# Patient Record
Sex: Female | Born: 1980 | Race: Black or African American | Hispanic: No | Marital: Single | Smoking: Never smoker
Health system: Southern US, Community
[De-identification: ages and names within clinical notes are randomized; demographics above are authoritative.]

## PROBLEM LIST (undated history)

## (undated) ENCOUNTER — Inpatient Hospital Stay (HOSPITAL_COMMUNITY): Payer: Self-pay

## (undated) DIAGNOSIS — R87619 Unspecified abnormal cytological findings in specimens from cervix uteri: Secondary | ICD-10-CM

## (undated) DIAGNOSIS — K219 Gastro-esophageal reflux disease without esophagitis: Secondary | ICD-10-CM

## (undated) DIAGNOSIS — D573 Sickle-cell trait: Secondary | ICD-10-CM

## (undated) DIAGNOSIS — O98319 Other infections with a predominantly sexual mode of transmission complicating pregnancy, unspecified trimester: Secondary | ICD-10-CM

## (undated) DIAGNOSIS — A630 Anogenital (venereal) warts: Secondary | ICD-10-CM

## (undated) DIAGNOSIS — B999 Unspecified infectious disease: Secondary | ICD-10-CM

## (undated) DIAGNOSIS — IMO0002 Reserved for concepts with insufficient information to code with codable children: Secondary | ICD-10-CM

## (undated) DIAGNOSIS — L709 Acne, unspecified: Secondary | ICD-10-CM

## (undated) HISTORY — PX: COLPOSCOPY: SHX161

## (undated) HISTORY — PX: NO PAST SURGERIES: SHX2092

---

## 2012-10-26 NOTE — L&D Delivery Note (Signed)
Delivery Note At 7:36 PM a viable female was delivered via Vaginal, Spontaneous Delivery (Presentation: Left Occiput Anterior).  APGAR: 9, 9; weight .   Placenta status: Intact, Spontaneous.  Cord: 3 vessels with the following complications: None.  Cord pH: not done  Anesthesia: Epidural Local  Episiotomy: Median Lacerations: 2nd degree Suture Repair: 2.0 vicryl Est. Blood Loss (mL):   Mom to postpartum.  Baby to nursery-stable.  MARSHALL,BERNARD A 08/18/2013, 7:51 PM

## 2012-10-31 ENCOUNTER — Emergency Department (INDEPENDENT_AMBULATORY_CARE_PROVIDER_SITE_OTHER)
Admission: EM | Admit: 2012-10-31 | Discharge: 2012-10-31 | Disposition: A | Discharge status: Home / Self Care | Payer: Self-pay | Source: Home / Self Care | Attending: Family Medicine | Admitting: Family Medicine

## 2012-10-31 ENCOUNTER — Encounter (HOSPITAL_COMMUNITY): Payer: Self-pay | Admitting: *Deleted

## 2012-10-31 DIAGNOSIS — J111 Influenza due to unidentified influenza virus with other respiratory manifestations: Secondary | ICD-10-CM

## 2012-10-31 NOTE — ED Provider Notes (Signed)
History     CSN: [id number]  Arrival date & time 10/31/12  1807   First MD Initiated Contact with Patient 10/31/12 1816      No chief complaint on file.   (Consider location/radiation/quality/duration/timing/severity/associated sxs/prior treatment) Patient is a 32 y.o. female presenting with pharyngitis. The history is provided by the patient.  Sore Throat This is a new problem. The current episode started more than 2 days ago. The problem has been gradually worsening. Pertinent negatives include no chest pain, no abdominal pain, no headaches and no shortness of breath.    No past medical history on file.  No past surgical history on file.  No family history on file.  History  Substance Use Topics  . Smoking status: Not on file  . Smokeless tobacco: Not on file  . Alcohol Use: Not on file    OB History    No data available      Review of Systems  Constitutional: Positive for fever and chills.  HENT: Positive for congestion, sore throat, rhinorrhea and postnasal drip.   Respiratory: Positive for cough. Negative for shortness of breath.   Cardiovascular: Negative for chest pain.  Gastrointestinal: Negative.  Negative for abdominal pain.  Genitourinary: Negative.   Neurological: Negative for headaches.    Allergies  Review of patient's allergies indicates not on file.  Home Medications  No current outpatient prescriptions on file.  BP 134/80  Pulse 80  Temp 98.5 F (36.9 C) (Oral)  Resp 18  SpO2 98%  Physical Exam  Nursing note and vitals reviewed. Constitutional: She is oriented to person, place, and time. She appears well-developed and well-nourished.  HENT:  Head: Normocephalic.  Right Ear: External ear normal.  Left Ear: External ear normal.  Mouth/Throat: Oropharynx is clear and moist.  Eyes: Conjunctivae normal and EOM are normal. Pupils are equal, round, and reactive to light.  Neck: Normal range of motion. Neck supple.  Cardiovascular: Normal  rate, regular rhythm, normal heart sounds and intact distal pulses.   Pulmonary/Chest: Effort normal and breath sounds normal.  Abdominal: Soft. Bowel sounds are normal.  Lymphadenopathy:    She has no cervical adenopathy.  Neurological: She is alert and oriented to person, place, and time.  Skin: Skin is warm and dry.  Psychiatric: She has a normal mood and affect.    ED Course  Procedures (including critical care time)  Labs Reviewed - No data to display No results found.   1. Influenza-like illness       MDM          Linna Hoff, MD 10/31/12 [phone]

## 2012-10-31 NOTE — ED Notes (Signed)
C/o sore throat, nausea and fever onset 3 days ago.  Was sick 3 weeks prior had a sore throat, vomiting and congestion, but got better.

## 2013-01-12 ENCOUNTER — Inpatient Hospital Stay (HOSPITAL_COMMUNITY): Payer: 59

## 2013-01-12 ENCOUNTER — Encounter (HOSPITAL_COMMUNITY): Payer: Self-pay | Admitting: *Deleted

## 2013-01-12 ENCOUNTER — Inpatient Hospital Stay (HOSPITAL_COMMUNITY)
Admission: AD | Admit: 2013-01-12 | Discharge: 2013-01-12 | Disposition: A | Discharge status: Home / Self Care | Payer: 59 | Source: Ambulatory Visit | Attending: Obstetrics & Gynecology | Admitting: Obstetrics & Gynecology

## 2013-01-12 DIAGNOSIS — Z3490 Encounter for supervision of normal pregnancy, unspecified, unspecified trimester: Secondary | ICD-10-CM

## 2013-01-12 DIAGNOSIS — O210 Mild hyperemesis gravidarum: Secondary | ICD-10-CM | POA: Insufficient documentation

## 2013-01-12 DIAGNOSIS — O219 Vomiting of pregnancy, unspecified: Secondary | ICD-10-CM

## 2013-01-12 HISTORY — DX: Reserved for concepts with insufficient information to code with codable children: IMO0002

## 2013-01-12 HISTORY — DX: Unspecified abnormal cytological findings in specimens from cervix uteri: R87.619

## 2013-01-12 LAB — URINALYSIS, ROUTINE W REFLEX MICROSCOPIC
Glucose, UA: NEGATIVE mg/dL
Ketones, ur: NEGATIVE mg/dL
Leukocytes, UA: NEGATIVE
Nitrite: NEGATIVE
Protein, ur: NEGATIVE mg/dL
Urobilinogen, UA: 0.2 mg/dL (ref 0.0–1.0)

## 2013-01-12 LAB — WET PREP, GENITAL: Trich, Wet Prep: NONE SEEN

## 2013-01-12 LAB — CBC
MCH: 24.6 pg — ABNORMAL LOW (ref 26.0–34.0)
MCHC: 34 g/dL (ref 30.0–36.0)
Platelets: 308 10*3/uL (ref 150–400)
RDW: 19 % — ABNORMAL HIGH (ref 11.5–15.5)

## 2013-01-12 LAB — HCG, QUANTITATIVE, PREGNANCY: hCG, Beta Chain, Quant, S: 22224 m[IU]/mL — ABNORMAL HIGH (ref <5)

## 2013-01-12 LAB — OB RESULTS CONSOLE GC/CHLAMYDIA: Gonorrhea: NEGATIVE

## 2013-01-12 LAB — POCT PREGNANCY, URINE: Preg Test, Ur: POSITIVE — AB

## 2013-01-12 MED ORDER — ONDANSETRON HCL 8 MG PO TABS: 8.0000 mg | ORAL_TABLET | Freq: Three times a day (TID) | ORAL | Status: DC | PRN

## 2013-01-12 MED ORDER — ONDANSETRON HCL 4 MG PO TABS
8.0000 mg | ORAL_TABLET | Freq: Once | ORAL | Status: AC
  Administered 2013-01-12: 8 mg via ORAL
  Filled 2013-01-12: qty 2

## 2013-01-12 MED ORDER — DOXYLAMINE-PYRIDOXINE 10-10 MG PO TBEC: 2.0000 | DELAYED_RELEASE_TABLET | Freq: Every evening | ORAL | Status: DC | PRN

## 2013-01-12 NOTE — MAU Note (Signed)
Name and DOB verified. Pt confirmed correct spelling on armband.

## 2013-01-12 NOTE — MAU Note (Signed)
Pt states positive upt 2 weeks ago. Denies abnormal vaginal d/c or bleeding.

## 2013-01-12 NOTE — MAU Provider Note (Signed)
History     CSN: [id number]  Arrival date and time: 01/12/13 1047   First Provider Initiated Contact with Patient 01/12/13 1314      Chief Complaint  Patient presents with  . Nausea   HPI THis is a 32 y.o. female at [redacted]w[redacted]d who presents with c/o nausea and weakness. Feels "dehydrated" though she drinks copious amounts of gatorade and other fluids.  Had "severe" abdominal pain last week which went away. No bleeding. Period in February came early and was not normal. Periods otherwise normal.  RN Note: Not feeling good, nauseated. First appt with Dr Tresa Res next week, + HPT, not confirmed anywhere yet. Bad cramps a couple days ago, none now.       OB History   Grav Para Term Preterm Abortions TAB SAB Ect Mult Living   1               Past Medical History  Diagnosis Date  . Abnormal Pap smear     Past Surgical History  Procedure Laterality Date  . Colposcopy    . Colposcopy      History reviewed. No pertinent family history.  History  Substance Use Topics  . Smoking status: Never Smoker   . Smokeless tobacco: Never Used  . Alcohol Use: Yes     Comment: occasional    Allergies: No Known Allergies  Prescriptions prior to admission  Medication Sig Dispense Refill  . acetaminophen (TYLENOL) 500 MG tablet Take 500 mg by mouth as needed for pain (cramps).      . Prenatal Vit-Fe Fumarate-FA (PRENATAL MULTIVITAMIN) TABS Take 1 tablet by mouth daily at 12 noon.        Review of Systems  Constitutional: Positive for fever, chills and malaise/fatigue.  Gastrointestinal: Positive for nausea. Negative for vomiting, abdominal pain, diarrhea and constipation.  Genitourinary: Negative for dysuria.  Neurological: Positive for weakness. Negative for headaches.   Physical Exam   Blood pressure 129/69, pulse 99, temperature 98.4 F (36.9 C), temperature source Oral, resp. rate 18, height 5\' 8"  (1.727 m), weight 167 lb (75.751 kg), last menstrual period 12/01/2012.  Physical  Exam  Constitutional: She is oriented to person, place, and time. She appears well-developed and well-nourished. No distress.  HENT:  Head: Normocephalic.  Cardiovascular: Normal rate.   Respiratory: Effort normal.  GI: Soft. She exhibits no distension and no mass. There is tenderness (bilater lower pelvis). There is no rebound and no guarding.  Genitourinary: Vagina normal and uterus normal. No vaginal discharge found.  Musculoskeletal: Normal range of motion.  Neurological: She is alert and oriented to person, place, and time.  Skin: Skin is warm and dry.  Psychiatric: She has a normal mood and affect.  Uterus small, 6 wk size  MAU Course  Procedures  MDM Cultures, CBC, Quant. Results for orders placed during the hospital encounter of 01/12/13 (from the past 24 hour(s))  URINALYSIS, ROUTINE W REFLEX MICROSCOPIC     Status: None   Collection Time    01/12/13 11:15 AM      Result Value Range   Color, Urine YELLOW  YELLOW   APPearance CLEAR  CLEAR   Specific Gravity, Urine 1.015  1.005 - 1.030   pH 5.5  5.0 - 8.0   Glucose, UA NEGATIVE  NEGATIVE mg/dL   Hgb urine dipstick NEGATIVE  NEGATIVE   Bilirubin Urine NEGATIVE  NEGATIVE   Ketones, ur NEGATIVE  NEGATIVE mg/dL   Protein, ur NEGATIVE  NEGATIVE mg/dL   Urobilinogen,  UA 0.2  0.0 - 1.0 mg/dL   Nitrite NEGATIVE  NEGATIVE   Leukocytes, UA NEGATIVE  NEGATIVE  POCT PREGNANCY, URINE     Status: Abnormal   Collection Time    01/12/13 11:33 AM      Result Value Range   Preg Test, Ur POSITIVE (*) NEGATIVE  HCG, QUANTITATIVE, PREGNANCY     Status: Abnormal   Collection Time    01/12/13  1:21 PM      Result Value Range   hCG, Beta Chain, Quant, S 22224 (*) <5 mIU/mL  CBC     Status: Abnormal   Collection Time    01/12/13  1:22 PM      Result Value Range   WBC 6.7  4.0 - 10.5 K/uL   RBC 4.72  3.87 - 5.11 MIL/uL   Hemoglobin 11.6 (*) 12.0 - 15.0 g/dL   HCT 16.1 (*) 09.6 - 04.5 %   MCV 72.2 (*) 78.0 - 100.0 fL   MCH 24.6 (*)  26.0 - 34.0 pg   MCHC 34.0  30.0 - 36.0 g/dL   RDW 40.9 (*) 81.1 - 91.4 %   Platelets 308  150 - 400 K/uL  WET PREP, GENITAL     Status: Abnormal   Collection Time    01/12/13  1:26 PM      Result Value Range   Yeast Wet Prep HPF POC NONE SEEN  NONE SEEN   Trich, Wet Prep NONE SEEN  NONE SEEN   Clue Cells Wet Prep HPF POC NONE SEEN  NONE SEEN   WBC, Wet Prep HPF POC MODERATE (*) NONE SEEN   No evidence of dehydration. Does not need IV. Will give zofran (with constipation warning) and PO hydrate. Will check [place] to r/o ectopic due to pain  Assessment and Plan  A:  SIUP at 6.0 weeks       Nausea with no evidence of dehydration  P:  Discharge home      Rx antiemetic      Advised to start prenatal care   Medication List    TAKE these medications       acetaminophen 500 MG tablet  Commonly known as:  TYLENOL  Take 500 mg by mouth as needed for pain (cramps).     Doxylamine-Pyridoxine 10-10 MG Tbec  Commonly known as:  DICLEGIS  Take 2 tablets by mouth at bedtime as needed.     ondansetron 8 MG tablet  Commonly known as:  ZOFRAN  Take 1 tablet (8 mg total) by mouth every 8 (eight) hours as needed for nausea.     prenatal multivitamin Tabs  Take 1 tablet by mouth daily at 12 noon.         Wynelle Bourgeois 01/12/2013, 1:31 PM

## 2013-01-12 NOTE — MAU Note (Addendum)
Not feeling good, nauseated.  First appt with Dr Tresa Res next week, + HPT, not confirmed anywhere yet.  Bad cramps a couple days ago, none now.

## 2013-01-13 LAB — GC/CHLAMYDIA PROBE AMP: CT Probe RNA: NEGATIVE

## 2013-02-16 ENCOUNTER — Telehealth: Payer: Self-pay | Admitting: *Deleted

## 2013-02-16 ENCOUNTER — Other Ambulatory Visit: Payer: Self-pay | Admitting: Obstetrics and Gynecology

## 2013-02-16 MED ORDER — ONDANSETRON HCL 8 MG PO TABS: 8.0000 mg | ORAL_TABLET | Freq: Three times a day (TID) | ORAL | Status: DC | PRN

## 2013-02-16 NOTE — Telephone Encounter (Signed)
Patient called requesting a refill on Zofran that she received at [facility].  Patient has a new patient appointment on May 8th.  Advised patient to return to [facility] for refill as we are not legally allowed to refill until her appointment on May 8th (per Dr. Clearance Coots).

## 2013-02-27 ENCOUNTER — Other Ambulatory Visit: Payer: Self-pay | Admitting: Advanced Practice Midwife

## 2013-02-27 MED ORDER — ONDANSETRON HCL 8 MG PO TABS: 8.0000 mg | ORAL_TABLET | Freq: Three times a day (TID) | ORAL | Status: DC | PRN

## 2013-02-27 NOTE — Progress Notes (Signed)
Pt was prescribed Zofran at MAU visit on 4/24, called requesting refill. Having ongoing n/v, hasn't been able to get appointment with her regular OB/GYN yet. Rx sent.

## 2013-03-02 ENCOUNTER — Ambulatory Visit: Payer: Self-pay | Admitting: Obstetrics & Gynecology

## 2013-03-02 ENCOUNTER — Encounter: Payer: 59 | Admitting: Obstetrics & Gynecology

## 2013-03-02 ENCOUNTER — Encounter: Payer: Self-pay | Admitting: Obstetrics & Gynecology

## 2013-03-02 ENCOUNTER — Ambulatory Visit (INDEPENDENT_AMBULATORY_CARE_PROVIDER_SITE_OTHER): Payer: 59 | Admitting: Obstetrics & Gynecology

## 2013-03-02 VITALS — BP 116/85 | Temp 98.2°F | Wt 142.0 lb

## 2013-03-02 VITALS — BP 116/85 | HR 111 | Temp 98.2°F | Ht 66.0 in | Wt 142.0 lb

## 2013-03-02 DIAGNOSIS — Z3402 Encounter for supervision of normal first pregnancy, second trimester: Secondary | ICD-10-CM

## 2013-03-02 DIAGNOSIS — Z3201 Encounter for pregnancy test, result positive: Secondary | ICD-10-CM

## 2013-03-02 DIAGNOSIS — A630 Anogenital (venereal) warts: Secondary | ICD-10-CM

## 2013-03-02 DIAGNOSIS — Z3401 Encounter for supervision of normal first pregnancy, first trimester: Secondary | ICD-10-CM

## 2013-03-02 DIAGNOSIS — Z3400 Encounter for supervision of normal first pregnancy, unspecified trimester: Secondary | ICD-10-CM | POA: Insufficient documentation

## 2013-03-02 DIAGNOSIS — O98319 Other infections with a predominantly sexual mode of transmission complicating pregnancy, unspecified trimester: Secondary | ICD-10-CM

## 2013-03-02 LAB — POCT URINALYSIS DIPSTICK
Nitrite, UA: NEGATIVE
Urobilinogen, UA: NEGATIVE
pH, UA: 6

## 2013-03-02 LAB — HIV ANTIBODY (ROUTINE TESTING W REFLEX): HIV: NONREACTIVE

## 2013-03-02 NOTE — Progress Notes (Signed)
Encounter entered in error.

## 2013-03-02 NOTE — Progress Notes (Signed)
Cardiac activity on U/S. 

## 2013-03-02 NOTE — Progress Notes (Signed)
P 111 PAP 3years ago Subjective:    Carlei Huang is being seen today for her first obstetrical visit.  This is not a planned pregnancy. She is at [redacted]w[redacted]d gestation. Her obstetrical history is significant for none. Relationship with FOB: involved. Patient undecided intend to breast feed. Pregnancy history fully reviewed.  Menstrual History: OB History   Grav Para Term Preterm Abortions TAB SAB Ect Mult Living   1                Patient's last menstrual period was 12/01/2012.    The following portions of the patient's history were reviewed and updated as appropriate: allergies, current medications, past family history, past medical history, past social history, past surgical history and problem list.  Review of Systems Pertinent items are noted in HPI.    Objective:   General Appearance:    Alert, cooperative, no distress, appears stated age  Head:    Normocephalic, without obvious abnormality, atraumatic  Eyes:    PERRL, conjunctiva/corneas clear, EOM's intact, fundi    benign, both eyes  Ears:    Normal TM's and external ear canals, both ears  Nose:   Nares normal, septum midline, mucosa normal, no drainage    or sinus tenderness  Throat:   Lips, mucosa, and tongue normal; teeth and gums normal  Neck:   Supple, symmetrical, trachea midline, no adenopathy;    thyroid:  no enlargement/tenderness/nodules; no carotid   bruit or JVD  Back:     Symmetric, no curvature, ROM normal, no CVA tenderness  Lungs:     Clear to auscultation bilaterally, respirations unlabored  Chest Wall:    No tenderness or deformity   Heart:    Regular rate and rhythm, S1 and S2 normal, no murmur, rub   or gallop  Breast Exam:    No tenderness, masses, or nipple abnormality  Abdomen:     Soft, non-tender, bowel sounds active all four quadrants,    no masses, no organomegaly  Genitalia:    Multiple 5 mm verrucous lesions, discharge or tenderness  Extremities:   Extremities normal, atraumatic, no cyanosis or  edema  Pulses:   2+ and symmetric all extremities  Skin:   Skin color, texture, turgor normal, no rashes or lesions  Lymph nodes:   Cervical, supraclavicular, and axillary nodes normal  Neurologic:   CNII-XII intact, normal strength, sensation and reflexes    throughout    Assessment:    Pregnancy at [redacted]w[redacted]d weeks    Plan:    Initial labs drawn. Prenatal vitamins. Problem list reviewed and updated. AFP3 discussed: undecided. Role of ultrasound in pregnancy discussed; fetal survey: requested. Amniocentesis discussed: not indicated. Follow up in 4 weeks. 50% of 20 min visit spent on counseling and coordination of care.

## 2013-03-02 NOTE — Patient Instructions (Signed)
CF Gene Mutation Testing This is a test used to detect cystic fibrosis (CF) genetic mutations to establish CF carrier status or to establish the diagnosis of CF in an individual. The CF gene mutation test identifies mutations in the CFTR gene on chromosome 7. Each cell in the human body (except sperm and eggs) has 46 chromosomes (23 inherited from the mother and 23 from the father). Genes on these chromosomes form the body's blueprint for producing proteins that control body functions. Cystic fibrosis is caused by a mutation in a pair of genes located on chromosomes 7. Both copies of this gene must be abnormal to cause CF. If only one copy of the gene pair is mutated, the patient will be a carrier. Carriers are not ill, they do not have any symptoms, but they can pass their abnormal CF gene copy on to their children.  When a newborn infant has meconium ileus (no stools in the first 24 to 48 hours of life) or when a person has symptoms of CF (salty sweat, persistent respiratory infections, wheezing, persistent diarrhea, foul-smelling greasy stools, malnutrition, and vitamin deficiency); if a person has a positive sweat chloride or IRT test or a close relative who has been diagnosed with CF; when a patient is undergoing genetic counseling and wants to find out if they are a CF carrier; or for prenatal diagnosis. PREPARATION FOR TEST A blood sample drawn from an infant's heel; a spot of blood that is put onto filter paper; or a blood sample is drawn from a vein in the arm. NORMAL FINDINGS No genetic mutation. Ranges for normal findings may vary among different laboratories and hospitals. You should always check with your doctor after having lab work or other tests done to discuss the meaning of your test results and whether your values are considered within normal limits. MEANING OF TEST Your caregiver will go over the test results with you and discuss the importance and meaning of your results, as well as  treatment options and the need for additional tests if necessary. OBTAINING THE TEST RESULTS It is your responsibility to obtain your test results. Ask the lab or department performing the test when and how you will get your results. Document Released: 11/05/2004 Document Revised: 01/04/2012 Document Reviewed: 09/19/2008 [employer] Patient Information 2013 [employer]. AFP Maternal This is a routine screen (tests) used to check for fetal abnormalities such as Down syndrome and neural tube defects. Down Syndrome is a chromosomal abnormality, sometimes called Trisomy 21. Neural tube defects are serious birth defects. The brain, spinal cord, or their coverings do not develop completely. Women should be tested in the 15th to 20th week of pregnancy. The msAFP screen involves three or four tests that measure substances found in the blood that make the testing better. During development, AFP levels in fetal blood and amniotic fluid rise until about 12 weeks. The levels then gradually fall until birth. AFP is a protein produce by fetal tissue. AFP crosses the placenta and appears in the maternal blood. A baby with an open neural tube defect has an opening in its spine, head, or abdominal wall that allows higher-than-usual amounts of AFP to pass into the mother's blood. If a screen is positive, more tests are needed to make a diagnosis. These include ultrasound and perhaps amniocentesis (checking the fluid that surrounds the baby). These tests are used to help women and their caregivers make decisions about the management of their pregnancies. In pregnancies where the fetus is carrying the chromosomal   defect that results in Down syndrome, the levels of AFP and unconjugated estriol tend to be low and hCG and inhibin A levels high.  PREPARATION FOR TEST Blood is drawn from a vein in your arm usually between the 15th and 20th weeks of pregnancy. Four different tests on your blood are done. These are AFP, hCG,  unconjugated estriol, and inhibin A. The combination of tests produces a more accurate result. NORMAL FINDINGS   Adult: less than 40ng/mL or less than 40 mg/L (SI units)  Child younger than1 year: less than 30 ng/mL Ranges are stratified by weeks of gestation and vary among laboratories. Ranges for normal findings may vary among different laboratories and hospitals. You should always check with your doctor after having lab work or other tests done to discuss the meaning of your test results and whether your values are considered within normal limits. MEANING OF TEST  These are screening tests. Not all fetal abnormalities will give positive test results. Of all women who have positive AFP screening results, only a very small number of them have babies who actually have a neural tube defect or chromosomal abnormality. Your caregiver will go over the test results with you and discuss the importance and meaning of your results, as well as treatment options and the need for additional tests if necessary. OBTAINING THE TEST RESULTS It is your responsibility to obtain your test results. Ask the lab or department performing the test when and how you will get your results. Document Released: 11/03/2004 Document Revised: 01/04/2012 Document Reviewed: 09/15/2008 [employer] Patient Information 2013 [employer].   

## 2013-03-03 LAB — OBSTETRIC PANEL
Eosinophils Absolute: 0.1 10*3/uL (ref 0.0–0.7)
Eosinophils Relative: 1 % (ref 0–5)
HCT: 36.5 % (ref 36.0–46.0)
Hemoglobin: 12.4 g/dL (ref 12.0–15.0)
Lymphocytes Relative: 31 % (ref 12–46)
Lymphs Abs: 1.5 10*3/uL (ref 0.7–4.0)
MCH: 24.9 pg — ABNORMAL LOW (ref 26.0–34.0)
MCV: 73.4 fL — ABNORMAL LOW (ref 78.0–100.0)
Monocytes Relative: 7 % (ref 3–12)
Platelets: 270 10*3/uL (ref 150–400)
RBC: 4.97 MIL/uL (ref 3.87–5.11)
Rh Type: POSITIVE
Rubella: 7.89 Index — ABNORMAL HIGH (ref <0.90)
WBC: 4.9 10*3/uL (ref 4.0–10.5)

## 2013-03-04 LAB — CULTURE, OB URINE: Colony Count: 5000

## 2013-03-06 ENCOUNTER — Encounter: Payer: Self-pay | Admitting: Obstetrics & Gynecology

## 2013-03-06 DIAGNOSIS — E559 Vitamin D deficiency, unspecified: Secondary | ICD-10-CM | POA: Insufficient documentation

## 2013-03-06 DIAGNOSIS — D573 Sickle-cell trait: Secondary | ICD-10-CM | POA: Insufficient documentation

## 2013-03-06 LAB — HEMOGLOBINOPATHY EVALUATION
Hemoglobin Other: 0 %
Hgb S Quant: 33.6 % — ABNORMAL HIGH

## 2013-03-06 LAB — PAP IG, CT-NG NAA, HPV HIGH-RISK: GC Probe Amp: NEGATIVE

## 2013-03-14 ENCOUNTER — Inpatient Hospital Stay (HOSPITAL_COMMUNITY)
Admission: AD | Admit: 2013-03-14 | Discharge: 2013-03-17 | DRG: 781 | Disposition: A | Discharge status: Home / Self Care | Payer: 59 | Source: Ambulatory Visit | Attending: Obstetrics | Admitting: Obstetrics

## 2013-03-14 ENCOUNTER — Encounter (HOSPITAL_COMMUNITY): Payer: Self-pay | Admitting: *Deleted

## 2013-03-14 DIAGNOSIS — E860 Dehydration: Secondary | ICD-10-CM | POA: Diagnosis present

## 2013-03-14 DIAGNOSIS — O211 Hyperemesis gravidarum with metabolic disturbance: Principal | ICD-10-CM | POA: Diagnosis present

## 2013-03-14 LAB — URINE MICROSCOPIC-ADD ON

## 2013-03-14 LAB — COMPREHENSIVE METABOLIC PANEL
ALT: 105 U/L — ABNORMAL HIGH (ref 0–35)
AST: 81 U/L — ABNORMAL HIGH (ref 0–37)
Albumin: 3.2 g/dL — ABNORMAL LOW (ref 3.5–5.2)
Calcium: 9.6 mg/dL (ref 8.4–10.5)
Chloride: 104 mEq/L (ref 96–112)
Creatinine, Ser: 0.53 mg/dL (ref 0.50–1.10)
Sodium: 136 mEq/L (ref 135–145)

## 2013-03-14 LAB — URINALYSIS, ROUTINE W REFLEX MICROSCOPIC
Leukocytes, UA: NEGATIVE
Nitrite: NEGATIVE
Specific Gravity, Urine: >1.03 — ABNORMAL HIGH (ref 1.005–1.030)
pH: 5.5 (ref 5.0–8.0)

## 2013-03-14 LAB — CBC
MCH: 25.6 pg — ABNORMAL LOW (ref 26.0–34.0)
MCV: 72.9 fL — ABNORMAL LOW (ref 78.0–100.0)
Platelets: 239 10*3/uL (ref 150–400)
RBC: 5.2 MIL/uL — ABNORMAL HIGH (ref 3.87–5.11)
RDW: 17.6 % — ABNORMAL HIGH (ref 11.5–15.5)
WBC: 8.4 10*3/uL (ref 4.0–10.5)

## 2013-03-14 MED ORDER — METHYLPREDNISOLONE 4 MG PO TABS: 8.0000 mg | ORAL_TABLET | Freq: Every day | ORAL | Status: DC

## 2013-03-14 MED ORDER — ONDANSETRON HCL 4 MG PO TABS
8.0000 mg | ORAL_TABLET | Freq: Four times a day (QID) | ORAL | Status: DC
  Administered 2013-03-14 – 2013-03-15 (×2): 8 mg via ORAL
  Filled 2013-03-14 (×2): qty 2

## 2013-03-14 MED ORDER — ONDANSETRON 8 MG/NS 50 ML IVPB
8.0000 mg | Freq: Once | INTRAVENOUS | Status: DC
  Filled 2013-03-14: qty 8

## 2013-03-14 MED ORDER — METHYLPREDNISOLONE 16 MG PO TABS: 16.0000 mg | ORAL_TABLET | Freq: Every day | ORAL | Status: DC

## 2013-03-14 MED ORDER — PROMETHAZINE HCL 25 MG PO TABS
25.0000 mg | ORAL_TABLET | Freq: Four times a day (QID) | ORAL | Status: DC | PRN
Start: 2013-03-14 — End: 2013-03-17

## 2013-03-14 MED ORDER — PROMETHAZINE HCL 25 MG/ML IJ SOLN
25.0000 mg | Freq: Once | INTRAMUSCULAR | Status: AC
  Administered 2013-03-14: 25 mg via INTRAVENOUS
  Filled 2013-03-14: qty 1

## 2013-03-14 MED ORDER — METHYLPREDNISOLONE 4 MG PO TABS: 4.0000 mg | ORAL_TABLET | Freq: Every day | ORAL | Status: DC

## 2013-03-14 MED ORDER — METHYLPREDNISOLONE 16 MG PO TABS
16.0000 mg | ORAL_TABLET | Freq: Every day | ORAL | Status: AC
  Filled 2013-03-14: qty 1

## 2013-03-14 MED ORDER — METHYLPREDNISOLONE SODIUM SUCC 125 MG IJ SOLR
48.0000 mg | Freq: Once | INTRAMUSCULAR | Status: AC
  Administered 2013-03-14: 48 mg via INTRAVENOUS
  Filled 2013-03-14: qty 0.77

## 2013-03-14 MED ORDER — METHYLPREDNISOLONE 4 MG PO TABS
8.0000 mg | ORAL_TABLET | Freq: Every day | ORAL | Status: DC
  Filled 2013-03-14: qty 2

## 2013-03-14 MED ORDER — LACTATED RINGERS IV SOLN
INTRAVENOUS | Status: DC
  Administered 2013-03-14 – 2013-03-17 (×8): via INTRAVENOUS

## 2013-03-14 MED ORDER — LACTATED RINGERS IV BOLUS (SEPSIS): 1000.0000 mL | Freq: Once | INTRAVENOUS | Status: DC

## 2013-03-14 MED ORDER — METHYLPREDNISOLONE 16 MG PO TABS
16.0000 mg | ORAL_TABLET | Freq: Every day | ORAL | Status: DC
  Administered 2013-03-15 – 2013-03-17 (×2): 16 mg via ORAL
  Filled 2013-03-14 (×2): qty 1

## 2013-03-14 NOTE — MAU Provider Note (Signed)
  History     CSN: [id number]  Arrival date and time: 03/14/13 1929   None     Chief Complaint  Patient presents with  . Nausea  . Emesis During Pregnancy   HPI  Alexis Yang is a 32 y.o. G1P0 at [redacted]w[redacted]d who presents today with nausea and vomiting. She has a rx for zofran, but states that it is not helping anymore. She is vomiting about 10 times per day.   pre-pregnant weight 166 03/02/13 142 lbs 03/14/13 131 lbs   Past Medical History  Diagnosis Date  . Abnormal Pap smear     Past Surgical History  Procedure Laterality Date  . Colposcopy      Family History  Problem Relation Age of Onset  . Hypertension Mother   . Cancer Maternal Grandmother   . Diabetes Maternal Grandfather   . Hypertension Maternal Grandfather     History  Substance Use Topics  . Smoking status: Never Smoker   . Smokeless tobacco: Never Used  . Alcohol Use: Yes     Comment: occasional    Allergies: No Known Allergies  Prescriptions prior to admission  Medication Sig Dispense Refill  . Pediatric Multiple Vit-C-FA (FLINSTONES GUMMIES OMEGA-3 DHA) CHEW Chew by mouth.      . ondansetron (ZOFRAN) 8 MG tablet Take 1 tablet (8 mg total) by mouth every 8 (eight) hours as needed for nausea.  20 tablet  2    ROS Physical Exam   Blood pressure 120/88, pulse 103, temperature 98.5 F (36.9 C), temperature source Oral, resp. rate 18, height 5\' 6"  (1.676 m), weight 59.603 kg (131 lb 6.4 oz), last menstrual period 12/01/2012.  Physical Exam  MAU Course  Procedures  Results for orders placed during the hospital encounter of 03/14/13 (from the past 24 hour(s))  URINALYSIS, ROUTINE W REFLEX MICROSCOPIC     Status: Abnormal   Collection Time    03/14/13  7:35 PM      Result Value Range   Color, Urine YELLOW  YELLOW   APPearance CLEAR  CLEAR   Specific Gravity, Urine >1.030 (*) 1.005 - 1.030   pH 5.5  5.0 - 8.0   Glucose, UA NEGATIVE  NEGATIVE mg/dL   Hgb urine dipstick TRACE (*) NEGATIVE   Bilirubin Urine NEGATIVE  NEGATIVE   Ketones, ur >80 (*) NEGATIVE mg/dL   Protein, ur NEGATIVE  NEGATIVE mg/dL   Urobilinogen, UA 0.2  0.0 - 1.0 mg/dL   Nitrite NEGATIVE  NEGATIVE   Leukocytes, UA NEGATIVE  NEGATIVE  URINE MICROSCOPIC-ADD ON     Status: Abnormal   Collection Time    03/14/13  7:35 PM      Result Value Range   Squamous Epithelial / LPF FEW (*) RARE   WBC, UA 3-6  <3 WBC/hpf   Bacteria, UA RARE  RARE   2150: Spoke with Dr. Clearance Coots, admit to 3rd floor for medrol taper.   Assessment and Plan  Hyperemesis Admit to Women's Unit Antiemetics Medrol taper for hyperemesis   Tawnya Crook 03/14/2013, 9:32 PM

## 2013-03-14 NOTE — MAU Note (Signed)
Pt states she has been taking Zofran since march, Pt states she started becoming more nauseous about 1 wk ago. Pt states she has not eaten anything in the last 2 days. Pt states she stopped taking medication the last 3 days.

## 2013-03-14 NOTE — MAU Note (Signed)
Zofran not working anymore can't keep anything down patient is 13 weeks.

## 2013-03-15 MED ORDER — ONDANSETRON 8 MG/NS 50 ML IVPB
8.0000 mg | Freq: Four times a day (QID) | INTRAVENOUS | Status: DC
  Administered 2013-03-15 – 2013-03-17 (×8): 8 mg via INTRAVENOUS
  Filled 2013-03-15 (×9): qty 8

## 2013-03-15 MED ORDER — GLYCOPYRROLATE 0.2 MG/ML IJ SOLN
0.1000 mg | Freq: Three times a day (TID) | INTRAMUSCULAR | Status: DC
  Administered 2013-03-15 – 2013-03-17 (×7): 0.1 mg via INTRAVENOUS
  Filled 2013-03-15 (×7): qty 0.5

## 2013-03-15 MED ORDER — GLYCOPYRROLATE 1 MG PO TABS
1.0000 mg | ORAL_TABLET | Freq: Three times a day (TID) | ORAL | Status: DC
  Filled 2013-03-15: qty 1

## 2013-03-15 MED ORDER — METHYLPREDNISOLONE SODIUM SUCC 40 MG IJ SOLR
16.0000 mg | INTRAMUSCULAR | Status: AC
  Administered 2013-03-15 – 2013-03-16 (×5): 16 mg via INTRAVENOUS
  Filled 2013-03-15 (×5): qty 0.4

## 2013-03-15 NOTE — Progress Notes (Signed)
Ur chart review completed.  

## 2013-03-15 NOTE — H&P (Signed)
Alexis Yang is a 32 y.o. female presenting for N/V. Maternal Medical History:  Reason for admission: Nausea.  31 y o G1.  EDC 09-10-13.  Presents with N/V.   Prenatal complications: N/V    OB History   Grav Para Term Preterm Abortions TAB SAB Ect Mult Living   1              Past Medical History  Diagnosis Date  . Abnormal Pap smear    Past Surgical History  Procedure Laterality Date  . Colposcopy     Family History: family history includes Cancer in her maternal grandmother; Diabetes in her maternal grandfather; and Hypertension in her maternal grandfather and mother. Social History:  reports that she has never smoked. She has never used smokeless tobacco. She reports that  drinks alcohol. She reports that she does not use illicit drugs.   Prenatal Transfer Tool  Maternal Diabetes: No Genetic Screening: Not done yet Maternal Ultrasounds/Referrals: Normal Fetal Ultrasounds or other Referrals:  None Maternal Substance Abuse:  No Significant Maternal Medications:  Zofran Significant Maternal Lab Results:  U/A, Cmet Other Comments:  None  Review of Systems  Gastrointestinal: Positive for nausea.      Blood pressure 103/62, pulse 71, temperature 97.8 F (36.6 C), temperature source Oral, resp. rate 18, height 5\' 6"  (1.676 m), weight 135 lb 4 oz (61.349 kg), last menstrual period 12/01/2012, SpO2 100.00%. Exam Physical Exam  Prenatal labs: ABO, Rh: O/POS/-- (05/08 1111) Antibody: NEG (05/08 1111) Rubella: 7.89 (05/08 1111) RPR: NON REAC (05/08 1111)  HBsAg: NEGATIVE (05/08 1111)  HIV: NON REACTIVE (05/08 1111)  GBS:     Assessment/Plan: Hyperemesis.  Dehydration.  Admitted for supportive management.   Lawton Dollinger A 03/15/2013, 8:52 AM

## 2013-03-15 NOTE — Progress Notes (Signed)
Patient ID: Alexis Yang, female   DOB: Mar 10, 1981, 32 y.o.   MRN: [id number] Hospital Day: 2  S: Preterm labor symptoms: None  O: Blood pressure 103/62, pulse 71, temperature 97.8 F (36.6 C), temperature source Oral, resp. rate 18, height 5\' 6"  (1.676 m), weight 135 lb 4 oz (61.349 kg), last menstrual period 12/01/2012, SpO2 100.00%.   WUJ:WJXBJYNW: 150 bpm Toco: None SVE:   A/P- 32 y.o. admitted with:  N/V, unresponsive to Zofran.  On steroid taper.  Continue supportive management.  Present on Admission:  **None**  Pregnancy Complications: Hyperemesis  Preterm labor management: no treatment necessary Dating:  [redacted]w[redacted]d PNL Needed:  none FWB:  good PTL:  stable

## 2013-03-15 NOTE — Progress Notes (Signed)
INITIAL NUTRITION ASSESSMENT  DOCUMENTATION CODES Per approved criteria  -Severe malnutrition in the context of acute illness or injury   INTERVENTION: C/L diet, advanced to antenatal regular ( to allow snacks TID) as tolerated  Consider tube feedings if steroid taper ineffective  NUTRITION DIAGNOSIS: Inadequate oral intake  related to hyperemesis  as evidenced by weight loss and pt report of n/v since [redacted] weeks pregnant.   Goal: Tolerance of po diet, allowing pt to meet estimated needs of second trimester pregnancy  Monitor:   diet tolerance and weight trend  Reason for Assessment: Hyperemesis Dx  32 y.o. female  Admitting Dx: <principal problem not specified> Patient Active Problem List   Diagnosis Date Noted  . Sickle cell trait 03/06/2013  . Unspecified vitamin D deficiency 03/06/2013  . Supervision of normal first pregnancy 03/02/2013  . Condyloma acuminata of vulva in pregnancy 03/02/2013    ASSESSMENT: Currently hungry and asking for C/L, no vomiting for 24 hours per pt report. Pt reports a 25 Lb weight loss since [redacted] weeks pregnant. Pt now with visible clavicles, temporal wasting,  loss of subcutaneous fat.  Pt can be Dx with severe degree of malnutrition in the context of acute illness based on a > 5 % loss of usual weight in one month, intake < 50 % of estimated needs for > 5 days, and moderate depletion of body fat.  Height: Ht Readings from Last 1 Encounters:  03/14/13 5\' 6"  (1.676 m)    Weight: Wt Readings from Last 1 Encounters:  03/15/13 135 lb 4 oz (61.349 kg)    Ideal Body Weight: 130 Lbs  % Ideal Body Weight: 103%  Wt Readings from Last 10 Encounters:  03/15/13 135 lb 4 oz (61.349 kg)  03/02/13 142 lb (64.411 kg)  03/02/13 142 lb (64.411 kg)  01/12/13 167 lb (75.751 kg)    Usual Body Weight: 160 Lbs  % Usual Body Weight: 84%  BMI:  Body mass index is 21.84 kg/(m^2).  Estimated Nutritional Needs: Kcal: 1900-2100 Protein: 75-85 Fluid: 2.3  Liters   Diet Order: Clear Liquid  EDUCATION NEEDS: -Education needs addressed. Pt and family educated on diet for hyperemesis. Copy of diet provided to pt. She was able to repeat back basic concepts of diet and had a good understanding. Family very supportive.   Intake/Output Summary (Last 24 hours) at 03/15/13 1430 Last data filed at 03/15/13 1207  Gross per 24 hour  Intake 1522.55 ml  Output   1250 ml  Net 272.55 ml    Labs:   Recent Labs Lab 03/14/13 2315 03/15/13 0535  NA 136  --   K 3.8  --   CL 104  --   CO2 15*  --   BUN 5*  --   CREATININE 0.53  --   CALCIUM 9.6  --   GLUCOSE 72 107*    CBG (last 3)  No results found for this basename: GLUCAP,  in the last 72 hours  Scheduled Meds: . glycopyrrolate  0.1 mg Intravenous TID  . lactated ringers  1,000 mL Intravenous Once  . methylPREDNISolone  16 mg Oral Q breakfast   Followed by  . [START ON 03/19/2013] methylPREDNISolone  8 mg Oral Q breakfast   Followed by  . [START ON 03/26/2013] methylPREDNISolone  4 mg Oral Q breakfast  . [COMPLETED] methylPREDNISolone  16 mg Oral Q1400   Followed by  . [START ON 03/17/2013] methylPREDNISolone  8 mg Oral Q1400   Followed by  . [  START ON 03/20/2013] methylPREDNISolone  4 mg Oral Q1400  . methylPREDNISolone  16 mg Oral QHS   Followed by  . [START ON 03/18/2013] methylPREDNISolone  8 mg Oral QHS   Followed by  . [START ON 03/21/2013] methylPREDNISolone  4 mg Oral QHS  . methylPREDNISolone (SOLU-MEDROL) injection  16 mg Intravenous Custom  . ondansetron (ZOFRAN) IV  8 mg Intravenous Q6H    Continuous Infusions: . lactated ringers 125 mL/hr at 03/15/13 1212    Past Medical History  Diagnosis Date  . Abnormal Pap smear     Past Surgical History  Procedure Laterality Date  . Colposcopy      [facility] M.Odis Luster LDN Neonatal Nutrition Support Specialist Pager [phone]

## 2013-03-16 LAB — GLUCOSE, CAPILLARY: Glucose-Capillary: 118 mg/dL — ABNORMAL HIGH (ref 70–99)

## 2013-03-16 NOTE — Progress Notes (Signed)
Patient ID: Alexis Yang, female   DOB: [birth year], 32 y.o.   MRN: [id number] Hospital Day: 3  S: Less nausea.  Tolerating some p o.  O: Blood pressure 96/59, pulse 76, temperature 98.2 F (36.8 C), temperature source Oral, resp. rate 18, height 5\' 6"  (1.676 m), weight 139 lb (63.05 kg), last menstrual period 12/01/2012, SpO2 99.00%.   WUJ:WJXBJYNW: 150 bpm Toco: None SVE:   A/P- 32 y.o. admitted with:  Hyperemesis.  Stable.  Continue supportive management.  Present on Admission:  **None**  Pregnancy Complications: Hyperemesis  Preterm labor management: no treatment necessary Dating:  [redacted]w[redacted]d PNL Needed:  none FWB:  good PTL:  none

## 2013-03-17 LAB — GLUCOSE, RANDOM: Glucose, Bld: 101 mg/dL — ABNORMAL HIGH (ref 70–99)

## 2013-03-17 MED ORDER — ONDANSETRON 8 MG PO TBDP: 8.0000 mg | ORAL_TABLET | Freq: Two times a day (BID) | ORAL | Status: DC | PRN

## 2013-03-17 MED ORDER — METHYLPREDNISOLONE 8 MG PO TABS: ORAL_TABLET | ORAL | Status: DC

## 2013-03-17 MED ORDER — GLYCOPYRROLATE 1 MG PO TABS: 1.0000 mg | ORAL_TABLET | Freq: Three times a day (TID) | ORAL | Status: DC

## 2013-03-17 NOTE — Progress Notes (Signed)
Patient ID: Alexis Yang, female   DOB: 17-Nov-1980, 32 y.o.   MRN: [id number] Hospital Day: 4  S: Tolerating diet.  O: Blood pressure 122/95, pulse 74, temperature 98 F (36.7 C), temperature source Oral, resp. rate 16, height 5\' 6"  (1.676 m), weight 141 lb (63.957 kg), last menstrual period 12/01/2012, SpO2 100.00%.   OZD:GUYQIHKV: 150 bpm Toco: None SVE:   A/P- 32 y.o. admitted with:  Hyperemesis with dehydration.  Improved.  Discharge home..  Present on Admission:  **None**  Pregnancy Complications: Hyperemesis  Preterm labor management: no treatment necessary Dating:  [redacted]w[redacted]d PNL Needed:  none FWB:  good PTL:  none

## 2013-03-17 NOTE — Discharge Summary (Signed)
Physician Discharge Summary  Patient ID: Alexis Yang MRN: [id number] DOB/AGE: 02-08-1981 32 y.o.  Admit date: 03/14/2013 Discharge date: 03/17/2013  Admission Diagnoses:  Hyperemesis Gravidarum, Dehydration  Discharge Diagnoses: Same, improved Active Problems:   * No active hospital problems. *   Discharged Condition: good  Hospital Course: Admitted at ~ 14 weeks with N/V, dehydration and inability to keep anything down.  Responded well to antiemetic therapy.  Now tolerating diet.  Discharged home in good condition.  Consults: None  Significant Diagnostic Studies: labs: CBC, CMET  Treatments: IV hydration, steroids: solu-medrol and antiemetics.  Discharge Exam: Blood pressure 122/95, pulse 74, temperature 98 F (36.7 C), temperature source Oral, resp. rate 16, height 5\' 6"  (1.676 m), weight 141 lb (63.957 kg), last menstrual period 12/01/2012, SpO2 100.00%. General appearance: alert and no distress GI: normal findings: soft, non-tender  Disposition: 01-Home or Self Care  Discharge Orders   Future Appointments Provider Department Dept Phone   04/05/2013 9:45 AM Antionette Char, MD [facility] [phone]   Future Orders Complete By Expires     Discharge activity:  No Restrictions  As directed     Discharge diet:  As directed     Discharge instructions  As directed     Comments:      Routine    No sexual activity restrictions  As directed         Medication List    STOP taking these medications       ondansetron 8 MG tablet  Commonly known as:  ZOFRAN      TAKE these medications       FLINSTONES GUMMIES OMEGA-3 DHA Chew  Chew by mouth.     glycopyrrolate 1 MG tablet  Commonly known as:  ROBINUL  Take 1 tablet (1 mg total) by mouth 3 (three) times daily.     methylPREDNISolone 8 MG tablet  Commonly known as:  MEDROL  See Medrol Taper Orders for Hyperemesis Gravidarum Patients.     ondansetron 8 MG disintegrating tablet  Commonly known as:   ZOFRAN ODT  Take 1 tablet (8 mg total) by mouth every 12 (twelve) hours as needed for nausea.           Follow-up Information   Follow up with Antionette Char A, MD In 4 weeks.   Contact information:   [address] [place] [address] [phone]       Signed: Brock Bad 03/17/2013, 9:07 AM

## 2013-03-17 NOTE — Progress Notes (Signed)
Discharge instructions given, reviewed medication prescriptions, and received a copy of the tapering schedule for Solumedrol to give to pharmacy. Patient verbalized understanding of all information. Patient ambulated off the unit with significant other and nurse. Patient and significant other ambulated to vehicle in the parking lot.

## 2013-03-20 ENCOUNTER — Encounter: Payer: Self-pay | Admitting: Obstetrics & Gynecology

## 2013-03-22 LAB — AFP, MATERNAL TRIPLE SCREEN
AFP MoM: 1.1
Gest Age at US: 5.6
HCG MoM: 0.86
HCG, Total: 30520 m[IU]/mL
OSB interpretation: NEGATIVE
Osb Risk: 1:19000 {titer}
Tri 18 Scr Risk Est: NEGATIVE
uE3 Mom: 0.91
uE3 Value: 0.3 ng/mL

## 2013-03-29 ENCOUNTER — Inpatient Hospital Stay (HOSPITAL_COMMUNITY)
Admission: AD | Admit: 2013-03-29 | Discharge: 2013-04-02 | DRG: 781 | Disposition: A | Discharge status: Home / Self Care | Payer: 59 | Source: Ambulatory Visit | Attending: Obstetrics & Gynecology | Admitting: Obstetrics & Gynecology

## 2013-03-29 ENCOUNTER — Encounter (HOSPITAL_COMMUNITY): Payer: Self-pay | Admitting: *Deleted

## 2013-03-29 DIAGNOSIS — O99891 Other specified diseases and conditions complicating pregnancy: Secondary | ICD-10-CM | POA: Diagnosis present

## 2013-03-29 DIAGNOSIS — D573 Sickle-cell trait: Secondary | ICD-10-CM | POA: Diagnosis present

## 2013-03-29 DIAGNOSIS — O2342 Unspecified infection of urinary tract in pregnancy, second trimester: Secondary | ICD-10-CM

## 2013-03-29 DIAGNOSIS — O99019 Anemia complicating pregnancy, unspecified trimester: Secondary | ICD-10-CM | POA: Diagnosis present

## 2013-03-29 DIAGNOSIS — O98519 Other viral diseases complicating pregnancy, unspecified trimester: Secondary | ICD-10-CM | POA: Diagnosis present

## 2013-03-29 DIAGNOSIS — O210 Mild hyperemesis gravidarum: Principal | ICD-10-CM | POA: Diagnosis present

## 2013-03-29 DIAGNOSIS — E43 Unspecified severe protein-calorie malnutrition: Secondary | ICD-10-CM | POA: Diagnosis present

## 2013-03-29 DIAGNOSIS — O211 Hyperemesis gravidarum with metabolic disturbance: Secondary | ICD-10-CM

## 2013-03-29 DIAGNOSIS — E559 Vitamin D deficiency, unspecified: Secondary | ICD-10-CM | POA: Diagnosis present

## 2013-03-29 DIAGNOSIS — A630 Anogenital (venereal) warts: Secondary | ICD-10-CM | POA: Diagnosis present

## 2013-03-29 LAB — URINALYSIS, ROUTINE W REFLEX MICROSCOPIC
Glucose, UA: NEGATIVE mg/dL
Hgb urine dipstick: NEGATIVE
Nitrite: POSITIVE — AB
Specific Gravity, Urine: >1.03 — ABNORMAL HIGH (ref 1.005–1.030)
pH: 6 (ref 5.0–8.0)

## 2013-03-29 LAB — COMPREHENSIVE METABOLIC PANEL
ALT: 99 U/L — ABNORMAL HIGH (ref 0–35)
AST: 45 U/L — ABNORMAL HIGH (ref 0–37)
CO2: 21 mEq/L (ref 19–32)
Calcium: 9.7 mg/dL (ref 8.4–10.5)
GFR calc non Af Amer: >90 mL/min (ref >90)
Potassium: 3.5 mEq/L (ref 3.5–5.1)
Sodium: 137 mEq/L (ref 135–145)
Total Protein: 6.7 g/dL (ref 6.0–8.3)

## 2013-03-29 LAB — URINE MICROSCOPIC-ADD ON

## 2013-03-29 LAB — CBC
MCH: 25.7 pg — ABNORMAL LOW (ref 26.0–34.0)
Platelets: 238 10*3/uL (ref 150–400)
RBC: 5.57 MIL/uL — ABNORMAL HIGH (ref 3.87–5.11)

## 2013-03-29 MED ORDER — PROMETHAZINE HCL 25 MG/ML IJ SOLN
25.0000 mg | Freq: Four times a day (QID) | INTRAMUSCULAR | Status: DC | PRN
  Administered 2013-03-31 (×2): 25 mg via INTRAVENOUS
  Filled 2013-03-29 (×3): qty 1

## 2013-03-29 MED ORDER — GLYCOPYRROLATE 0.2 MG/ML IJ SOLN
0.2000 mg | Freq: Four times a day (QID) | INTRAMUSCULAR | Status: DC | PRN
  Filled 2013-03-29: qty 1

## 2013-03-29 MED ORDER — ACETAMINOPHEN 325 MG PO TABS
650.0000 mg | ORAL_TABLET | ORAL | Status: DC | PRN
  Administered 2013-03-31 (×3): 650 mg via ORAL
  Filled 2013-03-29 (×3): qty 2

## 2013-03-29 MED ORDER — ONDANSETRON HCL 4 MG/2ML IJ SOLN
4.0000 mg | Freq: Four times a day (QID) | INTRAMUSCULAR | Status: DC | PRN
  Administered 2013-03-30 – 2013-04-01 (×3): 4 mg via INTRAVENOUS
  Filled 2013-03-29 (×3): qty 2

## 2013-03-29 MED ORDER — METOCLOPRAMIDE HCL 5 MG/ML IJ SOLN
10.0000 mg | Freq: Four times a day (QID) | INTRAMUSCULAR | Status: DC | PRN
  Filled 2013-03-29: qty 2

## 2013-03-29 MED ORDER — SODIUM CHLORIDE 0.9 % IV SOLN
Freq: Once | INTRAVENOUS | Status: AC
  Administered 2013-03-29: 23:00:00 via INTRAVENOUS

## 2013-03-29 MED ORDER — LACTATED RINGERS IV SOLN
INTRAVENOUS | Status: DC
  Administered 2013-03-30 – 2013-04-01 (×8): via INTRAVENOUS

## 2013-03-29 MED ORDER — DEXTROSE 5 % IV SOLN
1.0000 g | Freq: Once | INTRAVENOUS | Status: AC
  Administered 2013-03-29: 1 g via INTRAVENOUS
  Filled 2013-03-29: qty 10

## 2013-03-29 MED ORDER — PROMETHAZINE HCL 25 MG PO TABS: 25.0000 mg | ORAL_TABLET | Freq: Four times a day (QID) | ORAL | Status: DC | PRN

## 2013-03-29 MED ORDER — PROMETHAZINE HCL 25 MG RE SUPP: 25.0000 mg | Freq: Four times a day (QID) | RECTAL | Status: DC | PRN

## 2013-03-29 MED ORDER — PROMETHAZINE HCL 25 MG/ML IJ SOLN
25.0000 mg | Freq: Once | INTRAVENOUS | Status: AC
  Administered 2013-03-29: 25 mg via INTRAVENOUS
  Filled 2013-03-29: qty 1

## 2013-03-29 NOTE — MAU Provider Note (Signed)
Chief Complaint: Nausea and Morning Sickness  First Provider Initiated Contact with Patient 03/29/13 2120     SUBJECTIVE HPI: Alexis Yang is a 32 y.o. G1P0 at [redacted]w[redacted]d by LMP who presents with N/V and possible dehydration. Third MAU visit for same. Admitted 03/14/13-03/17/13. Medrol taper helped. Finished yesterday. N/V returned. Unable to keep anything down. ~20 lb wt loss so far this pregnancy per pt. Denies fever, chills, abd pain, vaginal bleeding, sick contacts.   Past Medical History  Diagnosis Date  . Abnormal Pap smear    OB History   Grav Para Term Preterm Abortions TAB SAB Ect Mult Living   1              # Outc Date GA Lbr Len/2nd Wgt Sex Del Anes PTL Lv   1 CUR              Past Surgical History  Procedure Laterality Date  . Colposcopy     History   Social History  . Marital Status: Single    Spouse Name: N/A    Number of Children: N/A  . Years of Education: N/A   Occupational History  . Not on file.   Social History Main Topics  . Smoking status: Never Smoker   . Smokeless tobacco: Never Used  . Alcohol Use: Yes     Comment: occasional  . Drug Use: No  . Sexually Active: Yes -- Female partner(s)    Birth Control/ Protection: None   Other Topics Concern  . Not on file   Social History Narrative  . No narrative on file   No current facility-administered medications on file prior to encounter.   Current Outpatient Prescriptions on File Prior to Encounter  Medication Sig Dispense Refill  . glycopyrrolate (ROBINUL) 1 MG tablet Take 1 tablet (1 mg total) by mouth 3 (three) times daily.  90 tablet  5  . ondansetron (ZOFRAN ODT) 8 MG disintegrating tablet Take 1 tablet (8 mg total) by mouth every 12 (twelve) hours as needed for nausea.  30 tablet  5  . Pediatric Multiple Vit-C-FA (FLINSTONES GUMMIES OMEGA-3 DHA) CHEW Chew 2 each by mouth daily.       . methylPREDNISolone (MEDROL) 8 MG tablet See Medrol Taper Orders for Hyperemesis Gravidarum Patients.  27  tablet  0   No Known Allergies  ROS: Pertinent items in HPI  OBJECTIVE Blood pressure 114/83, pulse 96, temperature 97.6 F (36.4 C), temperature source Oral, resp. rate 18, last menstrual period 12/01/2012. GENERAL: Well-developed, slender female in mild distress, weak-appearing.  HEENT: Normocephalic, mucus membranes dry. HEART: normal rate RESP: normal effort ABDOMEN: Soft, non-tender EXTREMITIES: Nontender, no edema NEURO: Alert and oriented SPECULUM EXAM: deferred FHR 154 by doppler.  LAB RESULTS Results for orders placed during the hospital encounter of 03/29/13 (from the past 24 hour(s))  URINALYSIS, ROUTINE W REFLEX MICROSCOPIC     Status: Abnormal   Collection Time    03/29/13  8:29 PM      Result Value Range   Color, Urine AMBER (*) YELLOW   APPearance CLEAR  CLEAR   Specific Gravity, Urine >1.030 (*) 1.005 - 1.030   pH 6.0  5.0 - 8.0   Glucose, UA NEGATIVE  NEGATIVE mg/dL   Hgb urine dipstick NEGATIVE  NEGATIVE   Bilirubin Urine MODERATE (*) NEGATIVE   Ketones, ur >80 (*) NEGATIVE mg/dL   Protein, ur 161 (*) NEGATIVE mg/dL   Urobilinogen, UA 2.0 (*) 0.0 - 1.0 mg/dL   Nitrite  POSITIVE (*) NEGATIVE   Leukocytes, UA TRACE (*) NEGATIVE  URINE MICROSCOPIC-ADD ON     Status: Abnormal   Collection Time    03/29/13  8:29 PM      Result Value Range   Squamous Epithelial / LPF MANY (*) RARE   WBC, UA 0-2  <3 WBC/hpf   RBC / HPF 0-2  <3 RBC/hpf   Urine-Other MUCOUS PRESENT    CBC     Status: Abnormal   Collection Time    03/29/13  9:40 PM      Result Value Range   WBC 5.7  4.0 - 10.5 K/uL   RBC 5.57 (*) 3.87 - 5.11 MIL/uL   Hemoglobin 14.3  12.0 - 15.0 g/dL   HCT 13.0  86.5 - 78.4 %   MCV 71.5 (*) 78.0 - 100.0 fL   MCH 25.7 (*) 26.0 - 34.0 pg   MCHC 35.9  30.0 - 36.0 g/dL   RDW 69.6 (*) 29.5 - 28.4 %   Platelets 238  150 - 400 K/uL    IMAGING No results found.  MAU COURSE Phenergan and rocephin ordered in MAU.   ASSESSMENT 1. Hyperemesis gravidarum  before end of [redacted] week gestation, dehydration   2. UTI in pregnancy, antepartum, second trimester    PLAN Admit to Women's Unit per Dr. Tamela Oddi. CMET and TSH pending.  [place], [place] 03/29/2013  10:28 PM

## 2013-03-29 NOTE — Progress Notes (Signed)
Pt states she feels dizzy when she stands up or changes position

## 2013-03-30 DIAGNOSIS — E43 Unspecified severe protein-calorie malnutrition: Secondary | ICD-10-CM | POA: Diagnosis present

## 2013-03-30 NOTE — H&P (Signed)
Alexis Yang is a 32 y.o. female presenting for N/V. Maternal Medical History:  Reason for admission: Nausea.  Presents w/N/V.  Recently admitted for hyperemesis gravidarum--she has completed a steroid taper.   Prenatal complications: N/V    OB History   Grav Para Term Preterm Abortions TAB SAB Ect Mult Living   1              Past Medical History  Diagnosis Date  . Abnormal Pap smear    Past Surgical History  Procedure Laterality Date  . Colposcopy     Family History: family history includes Cancer in her maternal grandmother; Diabetes in her maternal grandfather; and Hypertension in her maternal grandfather and mother. Social History:  reports that she has never smoked. She has never used smokeless tobacco. She reports that  drinks alcohol. She reports that she does not use illicit drugs.     Review of Systems  Gastrointestinal: Positive for nausea and vomiting.      Blood pressure 102/66, pulse 79, temperature 98.4 F (36.9 C), temperature source Oral, resp. rate 18, height 5\' 6"  (1.676 m), weight 126 lb 0.3 oz (57.162 kg), last menstrual period 12/01/2012, SpO2 100.00%. Maternal Exam:  Abdomen: Patient reports no abdominal tenderness. Introitus: not evaluated.   Cervix: not evaluated.   Fetal Exam Fetal Monitor Review: Mode: hand-held doppler probe.       Physical Exam  Constitutional: She appears well-developed.  HENT:  Head: Normocephalic.  Neck: Neck supple. No thyromegaly present.  Cardiovascular: Normal rate and regular rhythm.   Respiratory: Breath sounds normal.  GI: Soft. Bowel sounds are normal.  Skin: No rash noted.    Prenatal labs: ABO, Rh: O/POS/-- (05/08 1111) Antibody: NEG (05/08 1111) Rubella: 7.89 (05/08 1111) RPR: NON REAC (05/08 1111)  HBsAg: NEGATIVE (05/08 1111)  HIV: NON REACTIVE (05/08 1111)  GBS:    Results for orders placed during the hospital encounter of 03/29/13 (from the past 24 hour(s))  URINALYSIS, ROUTINE W  REFLEX MICROSCOPIC     Status: Abnormal   Collection Time    03/29/13  8:29 PM      Result Value Range   Color, Urine AMBER (*) YELLOW   APPearance CLEAR  CLEAR   Specific Gravity, Urine >1.030 (*) 1.005 - 1.030   pH 6.0  5.0 - 8.0   Glucose, UA NEGATIVE  NEGATIVE mg/dL   Hgb urine dipstick NEGATIVE  NEGATIVE   Bilirubin Urine MODERATE (*) NEGATIVE   Ketones, ur >80 (*) NEGATIVE mg/dL   Protein, ur 161 (*) NEGATIVE mg/dL   Urobilinogen, UA 2.0 (*) 0.0 - 1.0 mg/dL   Nitrite POSITIVE (*) NEGATIVE   Leukocytes, UA TRACE (*) NEGATIVE  URINE MICROSCOPIC-ADD ON     Status: Abnormal   Collection Time    03/29/13  8:29 PM      Result Value Range   Squamous Epithelial / LPF MANY (*) RARE   WBC, UA 0-2  <3 WBC/hpf   RBC / HPF 0-2  <3 RBC/hpf   Urine-Other MUCOUS PRESENT    CBC     Status: Abnormal   Collection Time    03/29/13  9:40 PM      Result Value Range   WBC 5.7  4.0 - 10.5 K/uL   RBC 5.57 (*) 3.87 - 5.11 MIL/uL   Hemoglobin 14.3  12.0 - 15.0 g/dL   HCT 09.6  04.5 - 40.9 %   MCV 71.5 (*) 78.0 - 100.0 fL   MCH 25.7 (*)  26.0 - 34.0 pg   MCHC 35.9  30.0 - 36.0 g/dL   RDW 19.1 (*) 47.8 - 29.5 %   Platelets 238  150 - 400 K/uL  COMPREHENSIVE METABOLIC PANEL     Status: Abnormal   Collection Time    03/29/13  9:40 PM      Result Value Range   Sodium 137  135 - 145 mEq/L   Potassium 3.5  3.5 - 5.1 mEq/L   Chloride 104  96 - 112 mEq/L   CO2 21  19 - 32 mEq/L   Glucose, Bld 77  70 - 99 mg/dL   BUN 7  6 - 23 mg/dL   Creatinine, Ser 6.21  0.50 - 1.10 mg/dL   Calcium 9.7  8.4 - 30.8 mg/dL   Total Protein 6.7  6.0 - 8.3 g/dL   Albumin 3.3 (*) 3.5 - 5.2 g/dL   AST 45 (*) 0 - 37 U/L   ALT 99 (*) 0 - 35 U/L   Alkaline Phosphatase 72  39 - 117 U/L   Total Bilirubin 0.8  0.3 - 1.2 mg/dL   GFR calc non Af Amer >90  >90 mL/min   GFR calc Af Amer >90  >90 mL/min  TSH     Status: None   Collection Time    03/29/13  9:40 PM      Result Value Range   TSH 1.484  0.350 - 4.500 uIU/mL    Assessment/Plan: Hyperemesis gravidarum.  IUP @ [redacted]w[redacted]d   Admit Supportive measures Antiemetics Nutritionist consult  JACKSON-MOORE,Cleburne Savini A 03/30/2013, 6:30 PM

## 2013-03-30 NOTE — Progress Notes (Signed)
INITIAL NUTRITION ASSESSMENT  DOCUMENTATION CODES Per approved criteria  -Severe malnutrition in the context of acute illness or injury   INTERVENTION:  Antenatal regular ( to allow snacks TID) as tolerated  Consider tube feedings if management with hydration and anti-emetics not sucessful  NUTRITION DIAGNOSIS: Inadequate oral intake  related to hyperemesis  as evidenced by weight loss and pt report of n/v since [redacted] weeks pregnant.   Goal: Tolerance of po diet, allowing pt to meet estimated needs of second trimester pregnancy  Monitor:   diet tolerance and weight trend  Reason for Assessment: Hyperemesis Dx  32 y.o. female  Admitting Dx: <principal problem not specified> Patient Active Problem List   Diagnosis Date Noted  . Sickle cell trait 03/06/2013  . Unspecified vitamin D deficiency 03/06/2013  . Supervision of normal first pregnancy 03/02/2013  . Condyloma acuminata of vulva in pregnancy 03/02/2013    ASSESSMENT: Currently tolerating small bites of fruit. Reports tolerating po diet at home after last admission until end of steroid taper when n/v returned.  Continues with weight loss, now a total of 34 lbs. Pt  with visible clavicles, temporal wasting,  loss of subcutaneous fat.  Pt can be Dx with severe degree of malnutrition in the context of acute illness based on a > 5 % loss of usual weight in one month, intake < 50 % of estimated needs for > 5 days, and moderate depletion of body fat.  Height: Ht Readings from Last 1 Encounters:  03/29/13 5\' 6"  (1.676 m)    Weight: Wt Readings from Last 1 Encounters:  03/30/13 126 lb 0.3 oz (57.162 kg)    Ideal Body Weight: 130 Lbs  % Ideal Body Weight: 97%  Wt Readings from Last 10 Encounters:  03/30/13 126 lb 0.3 oz (57.162 kg)  03/17/13 141 lb (63.957 kg)  03/02/13 142 lb (64.411 kg)  03/02/13 142 lb (64.411 kg)  01/12/13 167 lb (75.751 kg)    Usual Body Weight: 160 Lbs  % Usual Body Weight: 78 %  BMI:  Body  mass index is 20.35 kg/(m^2).  Estimated Nutritional Needs: Kcal: 1900-2100 Protein: 75-85 Fluid: 2.3 Liters   Diet Order: General  EDUCATION NEEDS: -Education needs addressed during last admission on 03/15/13  Intake/Output Summary (Last 24 hours) at 03/30/13 1452 Last data filed at 03/30/13 1200  Gross per 24 hour  Intake  711.8 ml  Output    450 ml  Net  261.8 ml    Labs:   Recent Labs Lab 03/29/13 2140  NA 137  K 3.5  CL 104  CO2 21  BUN 7  CREATININE 0.57  CALCIUM 9.7  GLUCOSE 77    CBG (last 3)  No results found for this basename: GLUCAP,  in the last 72 hours  Scheduled Meds:    Continuous Infusions: . lactated ringers 150 mL/hr at 03/30/13 9147    Past Medical History  Diagnosis Date  . Abnormal Pap smear     Past Surgical History  Procedure Laterality Date  . Colposcopy      [facility] M.Odis Luster LDN Neonatal Nutrition Support Specialist Pager [phone]

## 2013-03-30 NOTE — Progress Notes (Signed)
Patient ID: Alexis Yang, female   DOB: [birth year], 32 y.o.   MRN: [id number] Hospital Day: 2  S: Preterm labor symptoms: None  O: Blood pressure 110/73, pulse 80, temperature 98.3 F (36.8 C), temperature source Oral, resp. rate 18, height 5\' 6"  (1.676 m), weight 126 lb 0.3 oz (57.162 kg), last menstrual period 12/01/2012, SpO2 100.00%.   WUJ:WJXBJYNW: 150 bpm Toco: None SVE:   A/P- 32 y.o. admitted with:  N/V.  Stable.  Continue.  Supportive management.  Present on Admission:  **None**  Pregnancy Complications: Hyperemesis  Preterm labor management: no treatment necessary Dating:  [redacted]w[redacted]d PNL Needed:  CMET FWB:  good PTL:  stable ROD: n/a

## 2013-03-31 NOTE — Progress Notes (Signed)
Patient ID: Alexis Yang, female   DOB: [birth year], 32 y.o.   MRN: [id number] Hospital Day: 3  S: Preterm labor symptoms: None  O: Blood pressure 97/60, pulse 83, temperature 98.8 F (37.1 C), temperature source Oral, resp. rate 18, height 5\' 6"  (1.676 m), weight 128 lb (58.06 kg), last menstrual period 12/01/2012, SpO2 100.00%.   NWG:NFAOZHYQ: 150 bpm Toco: None SVE:   A/P- 32 y.o. admitted with:  N/V.  Improved.  Advance diet as tolerated.  Present on Admission:  **None**  Pregnancy Complications: Hyperemesis  Preterm labor management: no treatment necessary Dating:  [redacted]w[redacted]d PNL Needed:  none FWB:  good PTL:  none

## 2013-04-01 LAB — PHOSPHORUS: Phosphorus: 3.3 mg/dL (ref 2.3–4.6)

## 2013-04-01 MED ORDER — THIAMINE HCL 100 MG/ML IJ SOLN
Freq: Once | INTRAVENOUS | Status: AC
  Administered 2013-04-01: 13:00:00 via INTRAVENOUS
  Filled 2013-04-01: qty 1000

## 2013-04-01 MED ORDER — BOOST PLUS PO LIQD: 237.0000 mL | Freq: Three times a day (TID) | ORAL | Status: DC

## 2013-04-01 MED ORDER — BOOST / RESOURCE BREEZE PO LIQD
1.0000 | Freq: Three times a day (TID) | ORAL | Status: DC
  Administered 2013-04-01: 1 via ORAL
  Filled 2013-04-01 (×4): qty 1

## 2013-04-01 MED ORDER — ONDANSETRON 8 MG/NS 50 ML IVPB
8.0000 mg | Freq: Four times a day (QID) | INTRAVENOUS | Status: DC | PRN
  Administered 2013-04-02: 8 mg via INTRAVENOUS
  Filled 2013-04-01 (×2): qty 8

## 2013-04-01 MED ORDER — METOCLOPRAMIDE HCL 5 MG/ML IJ SOLN
10.0000 mg | Freq: Three times a day (TID) | INTRAMUSCULAR | Status: DC
  Administered 2013-04-01 (×2): 10 mg via INTRAVENOUS
  Filled 2013-04-01 (×2): qty 2

## 2013-04-01 NOTE — Progress Notes (Signed)
Patient ID: Alexis Yang, female   DOB: 05/14/1981, 32 y.o.   MRN: [id number] Hospital Day: 4  S: N/V yesterday--unable to tolerate lunch  O: Blood pressure 100/65, pulse 76, temperature 97.8 F (36.6 C), temperature source Oral, resp. rate 16, height 5\' 6"  (1.676 m), weight 135 lb 3 oz (61.321 kg), last menstrual period 12/01/2012, SpO2 99.00%.   JYN:WGNFAOZH: 150 bpm Toco: None  A/P- 32 y.o. admitted with Hyperemesis gravidarum; malnutrition per Nutritionist, tolerating some po Continue to optimize antiemetic regimen Pregnancy Complications: Hyperemesis  Preterm labor management: no treatment necessary Dating:  [redacted]w[redacted]d  PNL Needed:  none FWB:  good PTL:  none

## 2013-04-02 DIAGNOSIS — O210 Mild hyperemesis gravidarum: Secondary | ICD-10-CM | POA: Diagnosis present

## 2013-04-02 MED ORDER — METOCLOPRAMIDE HCL 10 MG PO TABS: 10.0000 mg | ORAL_TABLET | Freq: Three times a day (TID) | ORAL | Status: DC

## 2013-04-02 MED ORDER — METOCLOPRAMIDE HCL 10 MG PO TABS
10.0000 mg | ORAL_TABLET | ORAL | Status: AC
  Administered 2013-04-02: 10 mg via ORAL
  Filled 2013-04-02: qty 1

## 2013-04-02 NOTE — Progress Notes (Signed)
Discharge instructions provided to patient at bedside.  Medications, activity, diet, follow up appointments, when to call the doctor and community resources discussed.  No questions at this time.  Medication prescription electronically sent to pharmacy and patient confirms location is correct.  Patient left unit with all personal belongings in stable condition accompanied by Clinical research associate.  Osvaldo Angst, RN---------------

## 2013-04-02 NOTE — Plan of Care (Signed)
Problem: Discharge Progression Outcomes Goal: Barriers To Progression Addressed/Resolved Outcome: Progressing Ate dinner twice last night and tolerated it well.

## 2013-04-02 NOTE — Discharge Summary (Signed)
  Physician Discharge Summary  Patient ID: Alexis Yang MRN: [id number] DOB/AGE: 04/05/1981 31 y.o.  Admit date: 03/29/2013 Discharge date: 04/02/2013  Admission Diagnoses: Active Problems:   Protein-calorie malnutrition, severe   Hyperemesis gravidarum  Discharge Diagnoses:  Active Problems:   Protein-calorie malnutrition, severe   Hyperemesis gravidarum   Discharged Condition: stable  Hospital Course: The patient was given IV hydration and antiemetics.  Her nausea/vomiting improved.  A nutritionist was consulted.  Consults: Nutritionist  Significant Diagnostic Studies: labs: CBC/CMET/Mg/Phos  Treatments: see above  Discharge Exam: Blood pressure 112/69, pulse 79, temperature 98.4 F (36.9 C), temperature source Oral, resp. rate 16, height 5\' 6"  (1.676 m), weight 139 lb (63.05 kg), last menstrual period 12/01/2012, SpO2 99.00%. General appearance: alert GI: soft, non-tender; bowel sounds normal; no masses,  no organomegaly Extremities: extremities normal, atraumatic, no cyanosis or edema  Disposition: 01-Home or Self Care  Discharge Orders   Future Appointments Provider Department Dept Phone   04/05/2013 9:45 AM Antionette Char, MD [facility] [phone]   Future Orders Complete By Expires     Discharge activity:  No Restrictions  As directed     Discharge diet:  No restrictions  As directed     No sexual activity restrictions  As directed         Medication List    STOP taking these medications       methylPREDNISolone 8 MG tablet  Commonly known as:  MEDROL      TAKE these medications       FLINSTONES GUMMIES OMEGA-3 DHA Chew  Chew 2 each by mouth daily.     glycopyrrolate 1 MG tablet  Commonly known as:  ROBINUL  Take 1 tablet (1 mg total) by mouth 3 (three) times daily.     metoCLOPramide 10 MG tablet  Commonly known as:  REGLAN  Take 1 tablet (10 mg total) by mouth 3 (three) times daily before meals.     ondansetron 8 MG  disintegrating tablet  Commonly known as:  ZOFRAN ODT  Take 1 tablet (8 mg total) by mouth every 12 (twelve) hours as needed for nausea.           Follow-up Information   Follow up with HARPER,CHARLES A, MD. (Keep previous appointment)    Contact information:   [address] [place] [address] [phone]       Signed: Roseanna Rainbow 04/02/2013, 8:56 AM

## 2013-04-05 ENCOUNTER — Encounter: Payer: Self-pay | Admitting: Obstetrics & Gynecology

## 2013-04-05 ENCOUNTER — Ambulatory Visit (INDEPENDENT_AMBULATORY_CARE_PROVIDER_SITE_OTHER): Payer: 59 | Admitting: Obstetrics & Gynecology

## 2013-04-05 ENCOUNTER — Other Ambulatory Visit (INDEPENDENT_AMBULATORY_CARE_PROVIDER_SITE_OTHER): Payer: 59

## 2013-04-05 VITALS — BP 107/74 | Temp 98.6°F | Wt 138.0 lb

## 2013-04-05 DIAGNOSIS — Z3402 Encounter for supervision of normal first pregnancy, second trimester: Secondary | ICD-10-CM

## 2013-04-05 DIAGNOSIS — Z1389 Encounter for screening for other disorder: Secondary | ICD-10-CM

## 2013-04-05 DIAGNOSIS — Z3400 Encounter for supervision of normal first pregnancy, unspecified trimester: Secondary | ICD-10-CM

## 2013-04-05 LAB — POCT URINALYSIS DIPSTICK
Bilirubin, UA: NEGATIVE
Blood, UA: NEGATIVE
Nitrite, UA: NEGATIVE
Spec Grav, UA: 1.025
pH, UA: 5

## 2013-04-05 LAB — US OB DETAIL + 14 WK

## 2013-04-05 NOTE — Progress Notes (Signed)
Doing better.   

## 2013-04-05 NOTE — Progress Notes (Signed)
Pulse- 101. \ Pt states she was recently in the hospital for her nausea and vomiting. Pt states her vomiting has decreased since being released from the hospital.

## 2013-04-05 NOTE — Patient Instructions (Signed)
Pregnancy - Second Trimester The second trimester is the period between 13 to 27 weeks of your pregnancy. It is important to follow your doctor's instructions. HOME CARE   Do not smoke.  Do not drink alcohol or use drugs.  Only take medicine as told by your doctor.  Take prenatal vitamins as told. The vitamin should contain 1 milligram of folic acid.  Exercise.  Eat healthy foods. Eat regular, well-balanced meals.  You can have sex (intercourse) if there are no other problems with the pregnancy.  Do not use hot tubs, steam rooms, or saunas.  Wear a seat belt while driving.  Avoid raw meat, uncooked cheese, and litter boxes and soil used by cats.  Visit your dentist. Cleanings are okay. GET HELP RIGHT AWAY IF:   You have a temperature by mouth above 102 F (38.9 C), not controlled by medicine.  Fluid is coming from your vagina.  Blood is coming from your vagina. Light spotting is common, especially after sex (intercourse).  You have a bad smelling fluid (discharge) coming from the vagina. The fluid changes from clear to white.  You still feel sick to your stomach (nauseous).  You throw up (vomit) blood.  You lose or gain more than 2 pounds (0.9 kilograms) of weight in a week, or as suggested by your doctor.  Your face, hands, feet, or legs get puffy (swell).  You get exposed to German measles and have never had them.  You get exposed to fifth disease or chickenpox.  You have belly (abdominal) pain.  You have a bad headache that will not go away.  You have watery poop (diarrhea), pain when you pee (urinate), or have shortness of breath.  You start to have problems seeing (blurry or double vision).  You fall, are in a car accident, or have any kind of trauma.  There is mental or physical violence at home.  You have any concerns or worries during your pregnancy. MAKE SURE YOU:   Understand these instructions.  Will watch your condition.  Will get help  right away if you are not doing well or get worse. Document Released: 01/06/2010 Document Revised: 01/04/2012 Document Reviewed: 01/06/2010 ExitCare Patient Information 2014 [employer].  

## 2013-04-07 ENCOUNTER — Encounter: Payer: Self-pay | Admitting: Obstetrics

## 2013-04-07 LAB — URINE CULTURE

## 2013-04-11 ENCOUNTER — Other Ambulatory Visit: Payer: 59

## 2013-04-17 ENCOUNTER — Encounter: Payer: Self-pay | Admitting: Obstetrics & Gynecology

## 2013-04-19 ENCOUNTER — Encounter: Payer: 59 | Admitting: Obstetrics & Gynecology

## 2013-04-20 ENCOUNTER — Other Ambulatory Visit: Payer: Self-pay | Admitting: *Deleted

## 2013-04-20 DIAGNOSIS — Z3402 Encounter for supervision of normal first pregnancy, second trimester: Secondary | ICD-10-CM

## 2013-04-25 ENCOUNTER — Other Ambulatory Visit: Payer: 59

## 2013-04-26 ENCOUNTER — Ambulatory Visit (INDEPENDENT_AMBULATORY_CARE_PROVIDER_SITE_OTHER): Payer: 59 | Admitting: Obstetrics & Gynecology

## 2013-04-26 ENCOUNTER — Encounter: Payer: Self-pay | Admitting: Obstetrics & Gynecology

## 2013-04-26 VITALS — BP 113/72 | Temp 97.9°F | Wt 143.0 lb

## 2013-04-26 DIAGNOSIS — Z3402 Encounter for supervision of normal first pregnancy, second trimester: Secondary | ICD-10-CM

## 2013-04-26 DIAGNOSIS — Z3400 Encounter for supervision of normal first pregnancy, unspecified trimester: Secondary | ICD-10-CM

## 2013-04-26 LAB — POCT URINALYSIS DIPSTICK
Bilirubin, UA: NEGATIVE
Blood, UA: NEGATIVE
Glucose, UA: NEGATIVE
Ketones, UA: NEGATIVE
Nitrite, UA: NEGATIVE
Spec Grav, UA: 1.02
pH, UA: 5

## 2013-04-26 NOTE — Progress Notes (Signed)
Pulse-102 Pt c/o still having nausea and stomach "churning" and "issue with head".

## 2013-04-26 NOTE — Progress Notes (Signed)
Nausea/vomiting improved.

## 2013-04-26 NOTE — Patient Instructions (Addendum)
Glucose Tolerance Test This is a test to see how your body processes carbohydrates. This test is often done to check patients for diabetes or the possibility of developing it. PREPARATION FOR TEST You should have nothing to eat or drink 12 hours before the test. You will be given a form of sugar (glucose) and then blood samples will be drawn from your vein to determine the level of sugar in your blood. Alternatively, blood may be drawn from your finger for testing. You should not smoke or exercise during the test. NORMAL FINDINGS  Fasting: 70-115 mg/dL  30 minutes: less than 200 mg/dL  1 hour: less than 200 mg/dL  2 hours: less than 140 mg/dL  3 hours: 70-115 mg/dL  4 hours: 70-115 mg/dL Ranges for normal findings may vary among different laboratories and hospitals. You should always check with your doctor after having lab work or other tests done to discuss the meaning of your test results and whether your values are considered within normal limits. MEANING OF TEST Your caregiver will go over the test results with you and discuss the importance and meaning of your results, as well as treatment options and the need for additional tests. OBTAINING THE TEST RESULTS It is your responsibility to obtain your test results. Ask the lab or department performing the test when and how you will get your results. Document Released: 11/04/2004 Document Revised: 01/04/2012 Document Reviewed: 09/22/2008 ExitCare Patient Information 2014 [employer]. Tetanus, Diphtheria, Pertussis (Tdap) Vaccine What You Need to Know WHY GET VACCINATED? Tetanus, diphtheria and pertussis can be very serious diseases, even for adolescents and adults. Tdap vaccine can protect us from these diseases. TETANUS (Lockjaw) causes painful muscle tightening and stiffness, usually all over the body.  It can lead to tightening of muscles in the head and neck so you can't open your mouth, swallow, or sometimes even breathe.  Tetanus kills about 1 out of 5 people who are infected. DIPHTHERIA can cause a thick coating to form in the back of the throat.  It can lead to breathing problems, paralysis, heart failure, and death. PERTUSSIS (Whooping Cough) causes severe coughing spells, which can cause difficulty breathing, vomiting and disturbed sleep.  It can also lead to weight loss, incontinence, and rib fractures. Up to 2 in 100 adolescents and 5 in 100 adults with pertussis are hospitalized or have complications, which could include pneumonia and death. These diseases are caused by bacteria. Diphtheria and pertussis are spread from person to person through coughing or sneezing. Tetanus enters the body through cuts, scratches, or wounds. Before vaccines, the [place] saw as many as 200,000 cases a year of diphtheria and pertussis, and hundreds of cases of tetanus. Since vaccination began, tetanus and diphtheria have dropped by about 99% and pertussis by about 80%. TDAP VACCINE Tdap vaccine can protect adolescents and adults from tetanus, diphtheria, and pertussis. One dose of Tdap is routinely given at age 11 or 12. People who did not get Tdap at that age should get it as soon as possible. Tdap is especially important for health care professionals and anyone having close contact with a baby younger than 12 months. Pregnant women should get a dose of Tdap during every pregnancy, to protect the newborn from pertussis. Infants are most at risk for severe, life-threatening complications from pertussis. A similar vaccine, called Td, protects from tetanus and diphtheria, but not pertussis. A Td booster should be given every 10 years. Tdap may be given as one of these boosters   if you have not already gotten a dose. Tdap may also be given after a severe cut or burn to prevent tetanus infection. Your doctor can give you more information. Tdap may safely be given at the same time as other vaccines. SOME PEOPLE SHOULD NOT GET  THIS VACCINE  If you ever had a life-threatening allergic reaction after a dose of any tetanus, diphtheria, or pertussis containing vaccine, OR if you have a severe allergy to any part of this vaccine, you should not get Tdap. Tell your doctor if you have any severe allergies.  If you had a coma, or long or multiple seizures within 7 days after a childhood dose of DTP or DTaP, you should not get Tdap, unless a cause other than the vaccine was found. You can still get Td.  Talk to your doctor if you:  have epilepsy or another nervous system problem,  had severe pain or swelling after any vaccine containing diphtheria, tetanus or pertussis,  ever had Guillain-Barr Syndrome (GBS),  aren't feeling well on the day the shot is scheduled. RISKS OF A VACCINE REACTION With any medicine, including vaccines, there is a chance of side effects. These are usually mild and go away on their own, but serious reactions are also possible. Brief fainting spells can follow a vaccination, leading to injuries from falling. Sitting or lying down for about 15 minutes can help prevent these. Tell your doctor if you feel dizzy or light-headed, or have vision changes or ringing in the ears. Mild problems following Tdap (Did not interfere with activities)  Pain where the shot was given (about 3 in 4 adolescents or 2 in 3 adults)  Redness or swelling where the shot was given (about 1 person in 5)  Mild fever of at least 100.4F (up to about 1 in 25 adolescents or 1 in 100 adults)  Headache (about 3 or 4 people in 10)  Tiredness (about 1 person in 3 or 4)  Nausea, vomiting, diarrhea, stomach ache (up to 1 in 4 adolescents or 1 in 10 adults)  Chills, body aches, sore joints, rash, swollen glands (uncommon) Moderate problems following Tdap (Interfered with activities, but did not require medical attention)  Pain where the shot was given (about 1 in 5 adolescents or 1 in 100 adults)  Redness or swelling where  the shot was given (up to about 1 in 16 adolescents or 1 in 25 adults)  Fever over 102F (about 1 in 100 adolescents or 1 in 250 adults)  Headache (about 3 in 20 adolescents or 1 in 10 adults)  Nausea, vomiting, diarrhea, stomach ache (up to 1 or 3 people in 100)  Swelling of the entire arm where the shot was given (up to about 3 in 100). Severe problems following Tdap (Unable to perform usual activities, required medical attention)  Swelling, severe pain, bleeding and redness in the arm where the shot was given (rare). A severe allergic reaction could occur after any vaccine (estimated less than 1 in a million doses). WHAT IF THERE IS A SERIOUS REACTION? What should I look for?  Look for anything that concerns you, such as signs of a severe allergic reaction, very high fever, or behavior changes. Signs of a severe allergic reaction can include hives, swelling of the face and throat, difficulty breathing, a fast heartbeat, dizziness, and weakness. These would start a few minutes to a few hours after the vaccination. What should I do?  If you think it is a severe allergic reaction   or other emergency that can't wait, call 9-1-1 or get the person to the nearest hospital. Otherwise, call your doctor.  Afterward, the reaction should be reported to the "Vaccine Adverse Event Reporting System" (VAERS). Your doctor might file this report, or you can do it yourself through the VAERS web site at www.vaers.hhs.gov, or by calling 1-800-822-7967. VAERS is only for reporting reactions. They do not give medical advice.  THE NATIONAL VACCINE INJURY COMPENSATION PROGRAM The National Vaccine Injury Compensation Program (VICP) is a federal program that was created to compensate people who may have been injured by certain vaccines. Persons who believe they may have been injured by a vaccine can learn about the program and about filing a claim by calling 1-800-338-2382 or visiting the VICP website at  www.hrsa.gov/vaccinecompensation. HOW CAN I LEARN MORE?  Ask your doctor.  Call your local or state health department.  Contact the Centers for Disease Control and Prevention (CDC):  Call 1-800-232-4636 or visit CDC's website at www.cdc.gov/vaccines. CDC Tdap Vaccine VIS (03/03/12) Document Released: 04/12/2012 Document Revised: 07/06/2012 Document Reviewed: 04/12/2012 ExitCare Patient Information 2014 [employer].  

## 2013-05-03 ENCOUNTER — Encounter: Payer: Self-pay | Admitting: Obstetrics & Gynecology

## 2013-05-08 ENCOUNTER — Encounter: Payer: Self-pay | Admitting: Obstetrics

## 2013-05-23 ENCOUNTER — Encounter: Payer: Self-pay | Admitting: Obstetrics

## 2013-05-23 ENCOUNTER — Other Ambulatory Visit: Payer: Self-pay | Admitting: *Deleted

## 2013-05-23 DIAGNOSIS — O3432 Maternal care for cervical incompetence, second trimester: Secondary | ICD-10-CM

## 2013-05-23 DIAGNOSIS — Z3402 Encounter for supervision of normal first pregnancy, second trimester: Secondary | ICD-10-CM

## 2013-05-29 ENCOUNTER — Inpatient Hospital Stay (HOSPITAL_COMMUNITY)
Admission: AD | Admit: 2013-05-29 | Discharge: 2013-05-29 | Disposition: A | Discharge status: Home / Self Care | Payer: 59 | Source: Ambulatory Visit | Attending: Obstetrics & Gynecology | Admitting: Obstetrics & Gynecology

## 2013-05-29 ENCOUNTER — Encounter (HOSPITAL_COMMUNITY): Payer: Self-pay | Admitting: *Deleted

## 2013-05-29 DIAGNOSIS — O99891 Other specified diseases and conditions complicating pregnancy: Secondary | ICD-10-CM | POA: Insufficient documentation

## 2013-05-29 DIAGNOSIS — O210 Mild hyperemesis gravidarum: Secondary | ICD-10-CM | POA: Insufficient documentation

## 2013-05-29 DIAGNOSIS — R42 Dizziness and giddiness: Secondary | ICD-10-CM | POA: Insufficient documentation

## 2013-05-29 DIAGNOSIS — R109 Unspecified abdominal pain: Secondary | ICD-10-CM | POA: Insufficient documentation

## 2013-05-29 HISTORY — DX: Unspecified infectious disease: B99.9

## 2013-05-29 HISTORY — DX: Acne, unspecified: L70.9

## 2013-05-29 HISTORY — DX: Sickle-cell trait: D57.3

## 2013-05-29 LAB — COMPREHENSIVE METABOLIC PANEL
ALT: 18 U/L (ref 0–35)
AST: 22 U/L (ref 0–37)
CO2: 22 mEq/L (ref 19–32)
Calcium: 8.9 mg/dL (ref 8.4–10.5)
Chloride: 104 mEq/L (ref 96–112)
Creatinine, Ser: 0.53 mg/dL (ref 0.50–1.10)
GFR calc Af Amer: >90 mL/min (ref >90)
GFR calc non Af Amer: >90 mL/min (ref >90)
Glucose, Bld: 179 mg/dL — ABNORMAL HIGH (ref 70–99)
Sodium: 136 mEq/L (ref 135–145)
Total Bilirubin: 0.3 mg/dL (ref 0.3–1.2)

## 2013-05-29 LAB — URINALYSIS, ROUTINE W REFLEX MICROSCOPIC
Bilirubin Urine: NEGATIVE
Glucose, UA: NEGATIVE mg/dL
Hgb urine dipstick: NEGATIVE
Specific Gravity, Urine: 1.025 (ref 1.005–1.030)
Urobilinogen, UA: 0.2 mg/dL (ref 0.0–1.0)
pH: 6.5 (ref 5.0–8.0)

## 2013-05-29 LAB — CBC
Hemoglobin: 10.4 g/dL — ABNORMAL LOW (ref 12.0–15.0)
MCH: 24.9 pg — ABNORMAL LOW (ref 26.0–34.0)
MCV: 73.4 fL — ABNORMAL LOW (ref 78.0–100.0)
Platelets: 213 10*3/uL (ref 150–400)
RBC: 4.18 MIL/uL (ref 3.87–5.11)
WBC: 7.8 10*3/uL (ref 4.0–10.5)

## 2013-05-29 MED ORDER — DEXTROSE 5 % IN LACTATED RINGERS IV BOLUS
1000.0000 mL | Freq: Once | INTRAVENOUS | Status: AC
  Administered 2013-05-29: 1000 mL via INTRAVENOUS

## 2013-05-29 MED ORDER — PANTOPRAZOLE SODIUM 40 MG IV SOLR
40.0000 mg | Freq: Once | INTRAVENOUS | Status: AC
  Administered 2013-05-29: 40 mg via INTRAVENOUS
  Filled 2013-05-29: qty 40

## 2013-05-29 MED ORDER — ONDANSETRON 8 MG/NS 50 ML IVPB
8.0000 mg | Freq: Once | INTRAVENOUS | Status: AC
  Administered 2013-05-29: 8 mg via INTRAVENOUS
  Filled 2013-05-29: qty 8

## 2013-05-29 MED ORDER — LACTATED RINGERS IV BOLUS (SEPSIS)
1000.0000 mL | Freq: Once | INTRAVENOUS | Status: AC
  Administered 2013-05-29: 1000 mL via INTRAVENOUS

## 2013-05-29 NOTE — MAU Note (Signed)
Woke up during the night with n/v. Has had an ongoing problem with it this preg.  Reports abd pain, stomach gets tight and hard, maybe every .

## 2013-05-29 NOTE — MAU Note (Signed)
Up to bathroom, no problems while up.

## 2013-05-29 NOTE — MAU Provider Note (Signed)
Chief Complaint:  Emesis and Dizziness   First Provider Initiated Contact with Patient 05/29/13 [phone]      HPI: Alexis Yang is a 32 y.o. G1P0 at [redacted]w[redacted]d who presents to maternity admissions reporting exacerbation or N/V of pregnancy. Has been to MAU and admitted for this problem earlier in the pregnancy, but had bee doing better w/ Zofran. Woke up early this morning w/ N/V, snacked, vomited more. Felt very dizzy after lying down. Repeated this cycle 2 more times. Also reports tightening of abd Q18 minutes over the past few hours and heartburn.  Denies fever, chills, diarrhea, constipation leakage of fluid or vaginal bleeding. Good fetal movement.   Past Medical History: Past Medical History  Diagnosis Date  . Sickle cell trait   . Infection     UTI  . Acne   . Abnormal Pap smear     colpo- ok since    Past obstetric history: OB History   Grav Para Term Preterm Abortions TAB SAB Ect Mult Living   1              # Outc Date GA Lbr Len/2nd Wgt Sex Del Anes PTL Lv   1 CUR               Past Surgical History: Past Surgical History  Procedure Laterality Date  . Colposcopy    . No past surgeries      Family History: Family History  Problem Relation Age of Onset  . Hypertension Mother   . Cancer Maternal Grandmother     stomach  . Diabetes Maternal Grandfather   . Hypertension Maternal Grandfather   . Diabetes Maternal Aunt     Social History: History  Substance Use Topics  . Smoking status: Never Smoker   . Smokeless tobacco: Never Used  . Alcohol Use: Yes     Comment: occasional    Allergies: No Known Allergies  Meds:  Prescriptions prior to admission  Medication Sig Dispense Refill  . cholecalciferol (VITAMIN D) 1000 UNITS tablet Take 1,000 Units by mouth daily.      . metoCLOPramide (REGLAN) 10 MG tablet Take 1 tablet (10 mg total) by mouth 3 (three) times daily before meals.  90 tablet  2  . ondansetron (ZOFRAN ODT) 8 MG disintegrating tablet Take 1 tablet  (8 mg total) by mouth every 12 (twelve) hours as needed for nausea.  30 tablet  5  . Prenatal Vit-Min-FA-Fish Oil ([employer] PRENATAL GUMMY PO) Take 2 tablets by mouth daily.        ROS: Pertinent findings in history of present illness.  Physical Exam  Blood pressure 113/62, pulse 79, temperature 97.2 F (36.2 C), temperature source Oral, resp. rate 18, height 5\' 5"  (1.651 m), weight 67.132 kg (148 lb), last menstrual period 12/01/2012. GENERAL: Well-developed, well-nourished female in mild distress.  HEENT: normocephalic. Mucus membranes moist. Vomiting actively.  HEART: normal rate RESP: normal effort ABDOMEN: Soft, non-tender, gravid appropriate for gestational age EXTREMITIES: Nontender, no edema NEURO: alert and oriented PELVIC EXAM: NEFG, physiologic discharge. Dilation: Closed Effacement (%): Thick Station: Ballotable Exam by:: [place]  FHT:  Baseline 145 , moderate variability, accelerations present, no decelerations Contractions: q 4-5 mins, mild   Labs: Results for orders placed during the hospital encounter of 05/29/13 (from the past 24 hour(s))  URINALYSIS, ROUTINE W REFLEX MICROSCOPIC     Status: Abnormal   Collection Time    05/29/13  8:10 AM      Result Value Range  Color, Urine YELLOW  YELLOW   APPearance CLEAR  CLEAR   Specific Gravity, Urine 1.025  1.005 - 1.030   pH 6.5  5.0 - 8.0   Glucose, UA NEGATIVE  NEGATIVE mg/dL   Hgb urine dipstick NEGATIVE  NEGATIVE   Bilirubin Urine NEGATIVE  NEGATIVE   Ketones, ur 15 (*) NEGATIVE mg/dL   Protein, ur NEGATIVE  NEGATIVE mg/dL   Urobilinogen, UA 0.2  0.0 - 1.0 mg/dL   Nitrite NEGATIVE  NEGATIVE   Leukocytes, UA NEGATIVE  NEGATIVE  CBC     Status: Abnormal   Collection Time    05/29/13 10:55 AM      Result Value Range   WBC 7.8  4.0 - 10.5 K/uL   RBC 4.18  3.87 - 5.11 MIL/uL   Hemoglobin 10.4 (*) 12.0 - 15.0 g/dL   HCT 16.1 (*) 09.6 - 04.5 %   MCV 73.4 (*) 78.0 - 100.0 fL   MCH 24.9 (*) 26.0 - 34.0 pg   MCHC  33.9  30.0 - 36.0 g/dL   RDW 40.9  81.1 - 91.4 %   Platelets 213  150 - 400 K/uL  COMPREHENSIVE METABOLIC PANEL     Status: Abnormal   Collection Time    05/29/13 10:55 AM      Result Value Range   Sodium 136  135 - 145 mEq/L   Potassium 3.7  3.5 - 5.1 mEq/L   Chloride 104  96 - 112 mEq/L   CO2 22  19 - 32 mEq/L   Glucose, Bld 179 (*) 70 - 99 mg/dL   BUN 5 (*) 6 - 23 mg/dL   Creatinine, Ser 7.82  0.50 - 1.10 mg/dL   Calcium 8.9  8.4 - 95.6 mg/dL   Total Protein 6.1  6.0 - 8.3 g/dL   Albumin 2.5 (*) 3.5 - 5.2 g/dL   AST 22  0 - 37 U/L   ALT 18  0 - 35 U/L   Alkaline Phosphatase 86  39 - 117 U/L   Total Bilirubin 0.3  0.3 - 1.2 mg/dL   GFR calc non Af Amer >90  >90 mL/min   GFR calc Af Amer >90  >90 mL/min    Imaging:  No results found.  MAU Course: Contractions resolved w/ IV bolus. N/V resolved w/ Zofran. Dizziness improved, but one episode while in MAU.   Assessment: 1. Hyperemesis gravidarum   2. Vertigo    Plan: Discharge home Preterm labor precautions and fetal kick counts. Advance diet slowly. Small, frequent meals and increase fluids.      Follow-up Information   Follow up with [facility]. (as scheduled)    Contact information:   [address] [place] [address] [phone]      Follow up with [facility] [place] [facility]. (As needed if symptoms worsen)    Contact information:   [id number] [place] [address] [phone]       Medication List         cholecalciferol 1000 UNITS tablet  Commonly known as:  VITAMIN D  Take 1,000 Units by mouth daily.     [employer] PRENATAL GUMMY PO  Take 2 tablets by mouth daily.     metoCLOPramide 10 MG tablet  Commonly known as:  REGLAN  Take 1 tablet (10 mg total) by mouth 3 (three) times daily before meals.     ondansetron 8 MG disintegrating tablet  Commonly known as:  ZOFRAN ODT  Take 1 tablet (8 mg total) by mouth every 12  (twelve) hours as needed for nausea.       [place], CNM 05/29/2013 1:03 PM

## 2013-05-29 NOTE — MAU Note (Signed)
Patient states she started vomiting at 0200 with mild abdominal cramping. Feels dizzy at times. Reports good fetal movement. Patient has been admitted for hyperemesis in the past. No bleeding or leaking.

## 2013-05-31 ENCOUNTER — Encounter: Payer: Self-pay | Admitting: Obstetrics

## 2013-05-31 ENCOUNTER — Other Ambulatory Visit: Payer: 59

## 2013-05-31 ENCOUNTER — Ambulatory Visit (INDEPENDENT_AMBULATORY_CARE_PROVIDER_SITE_OTHER): Payer: 59 | Admitting: Obstetrics & Gynecology

## 2013-05-31 ENCOUNTER — Inpatient Hospital Stay (HOSPITAL_COMMUNITY)
Admission: AD | Admit: 2013-05-31 | Discharge: 2013-05-31 | Disposition: A | Discharge status: Home / Self Care | Payer: 59 | Source: Ambulatory Visit | Attending: Obstetrics & Gynecology | Admitting: Obstetrics & Gynecology

## 2013-05-31 ENCOUNTER — Ambulatory Visit (INDEPENDENT_AMBULATORY_CARE_PROVIDER_SITE_OTHER): Payer: 59

## 2013-05-31 ENCOUNTER — Encounter (HOSPITAL_COMMUNITY): Payer: Self-pay | Admitting: *Deleted

## 2013-05-31 VITALS — BP 92/56 | Temp 97.1°F | Wt 148.0 lb

## 2013-05-31 DIAGNOSIS — O365990 Maternal care for other known or suspected poor fetal growth, unspecified trimester, not applicable or unspecified: Secondary | ICD-10-CM

## 2013-05-31 DIAGNOSIS — E559 Vitamin D deficiency, unspecified: Secondary | ICD-10-CM

## 2013-05-31 DIAGNOSIS — A630 Anogenital (venereal) warts: Secondary | ICD-10-CM

## 2013-05-31 DIAGNOSIS — O4700 False labor before 37 completed weeks of gestation, unspecified trimester: Secondary | ICD-10-CM | POA: Insufficient documentation

## 2013-05-31 DIAGNOSIS — Z3402 Encounter for supervision of normal first pregnancy, second trimester: Secondary | ICD-10-CM

## 2013-05-31 DIAGNOSIS — O3430 Maternal care for cervical incompetence, unspecified trimester: Secondary | ICD-10-CM

## 2013-05-31 DIAGNOSIS — Z3400 Encounter for supervision of normal first pregnancy, unspecified trimester: Secondary | ICD-10-CM

## 2013-05-31 DIAGNOSIS — O210 Mild hyperemesis gravidarum: Secondary | ICD-10-CM

## 2013-05-31 DIAGNOSIS — D573 Sickle-cell trait: Secondary | ICD-10-CM

## 2013-05-31 DIAGNOSIS — O479 False labor, unspecified: Secondary | ICD-10-CM

## 2013-05-31 DIAGNOSIS — O3432 Maternal care for cervical incompetence, second trimester: Secondary | ICD-10-CM

## 2013-05-31 LAB — POCT URINALYSIS DIPSTICK
Blood, UA: NEGATIVE
Nitrite, UA: NEGATIVE
Spec Grav, UA: 1.015
Urobilinogen, UA: NEGATIVE

## 2013-05-31 LAB — US OB DETAIL + 14 WK

## 2013-05-31 MED ORDER — NIFEDIPINE 10 MG PO CAPS
10.0000 mg | ORAL_CAPSULE | ORAL | Status: AC
  Administered 2013-05-31 (×3): 10 mg via ORAL
  Filled 2013-05-31 (×3): qty 1

## 2013-05-31 MED ORDER — LACTATED RINGERS IV BOLUS (SEPSIS)
1000.0000 mL | Freq: Once | INTRAVENOUS | Status: AC
  Administered 2013-05-31: 1000 mL via INTRAVENOUS

## 2013-05-31 NOTE — MAU Provider Note (Signed)
History     CSN: [id number]  Arrival date and time: 05/31/13 1436   None     Chief Complaint  Patient presents with  . Labor Eval   HPI  Alexis Yang is a 32 y.o. G1P0 at [redacted]w[redacted]d who was sent over from the office for UCs. She states she had a cervical length measurement there that was 2.2 cm. She denies any bleeding or LOF. She denies pain, but does feel tightening.   Past Medical History  Diagnosis Date  . Sickle cell trait   . Infection     UTI  . Acne   . Abnormal Pap smear     colpo- ok since    Past Surgical History  Procedure Laterality Date  . Colposcopy    . No past surgeries      Family History  Problem Relation Age of Onset  . Hypertension Mother   . Cancer Maternal Grandmother     stomach  . Diabetes Maternal Grandfather   . Hypertension Maternal Grandfather   . Diabetes Maternal Aunt     History  Substance Use Topics  . Smoking status: Never Smoker   . Smokeless tobacco: Never Used  . Alcohol Use: Yes     Comment: occasional    Allergies: No Known Allergies  Prescriptions prior to admission  Medication Sig Dispense Refill  . calcium carbonate (TUMS - DOSED IN MG ELEMENTAL CALCIUM) 500 MG chewable tablet Chew 1 tablet by mouth daily as needed for heartburn.      . cholecalciferol (VITAMIN D) 1000 UNITS tablet Take 1,000 Units by mouth daily.      . metoCLOPramide (REGLAN) 10 MG tablet Take 1 tablet (10 mg total) by mouth 3 (three) times daily before meals.  90 tablet  2  . ondansetron (ZOFRAN ODT) 8 MG disintegrating tablet Take 1 tablet (8 mg total) by mouth every 12 (twelve) hours as needed for nausea.  30 tablet  5  . Prenatal Vit-Min-FA-Fish Oil (CVS PRENATAL GUMMY PO) Take 2 tablets by mouth daily.        ROS Physical Exam   Blood pressure 118/76, pulse 95, temperature 98.3 F (36.8 C), temperature source Oral, resp. rate 18, last menstrual period 12/01/2012.  Physical Exam  Nursing note and vitals reviewed. Constitutional: She is  oriented to person, place, and time. She appears well-developed and well-nourished. No distress.  Cardiovascular: Normal rate.   GI: Soft.  Neurological: She is alert and oriented to person, place, and time.  Skin: Skin is warm and dry.  Psychiatric: She has a normal mood and affect.   FHT: 130s moderate with accels, no decels Toco: frequent IU MAU Course  Procedures  Results for orders placed in visit on 05/31/13 (from the past 24 hour(s))  POCT URINALYSIS DIPSTICK     Status: None   Collection Time    05/31/13  9:50 AM      Result Value Range   Color, UA YELLOW     Clarity, UA CLEAR     Glucose, UA NEGATIVE     Bilirubin, UA NEGATIVE     Ketones, UA 3+     Spec Grav, UA 1.015     Blood, UA NEGATIVE     pH, UA 6.0     Protein, UA TRACE     Urobilinogen, UA negative     Nitrite, UA NEGATIVE     Leukocytes, UA Negative    1730: Patient reports feeling better. She is no longer feeling the  contractions. The monitor shows that she is having irritability  at this time.   1733: C/W Dr. Tamela Oddi. Plan to recheck cervix. If no change patient will return on Friday for FFN.  1740: Cervix: closed/thick/high/posterior  Assessment and Plan   Preterm contractions PTL danger signs Return to MAU on Friday for FFN   Tawnya Crook 05/31/2013, 3:50 PM

## 2013-05-31 NOTE — MAU Note (Signed)
Sent from office.  Having contractions, pt aware of tightening, but no pain.  Denies bleeding or leaking.  Cervix is 2.2cm long.

## 2013-05-31 NOTE — Progress Notes (Signed)
Pulse-103 Pt c/o feeling dizzy, nauseous, and vomiting x 2 days with decreased appetite.

## 2013-06-01 ENCOUNTER — Encounter: Payer: Self-pay | Admitting: Obstetrics & Gynecology

## 2013-06-01 ENCOUNTER — Encounter: Payer: Self-pay | Admitting: Obstetrics

## 2013-06-02 ENCOUNTER — Encounter (HOSPITAL_COMMUNITY): Payer: Self-pay

## 2013-06-02 ENCOUNTER — Inpatient Hospital Stay (HOSPITAL_COMMUNITY)
Admission: AD | Admit: 2013-06-02 | Discharge: 2013-06-02 | Disposition: A | Discharge status: Home / Self Care | Payer: 59 | Source: Ambulatory Visit | Attending: Obstetrics | Admitting: Obstetrics

## 2013-06-02 DIAGNOSIS — O479 False labor, unspecified: Secondary | ICD-10-CM

## 2013-06-02 DIAGNOSIS — O4700 False labor before 37 completed weeks of gestation, unspecified trimester: Secondary | ICD-10-CM | POA: Insufficient documentation

## 2013-06-02 LAB — URINALYSIS, ROUTINE W REFLEX MICROSCOPIC
Nitrite: NEGATIVE
Specific Gravity, Urine: >1.03 — ABNORMAL HIGH (ref 1.005–1.030)
Urobilinogen, UA: 0.2 mg/dL (ref 0.0–1.0)
pH: 6 (ref 5.0–8.0)

## 2013-06-02 LAB — FETAL FIBRONECTIN: Fetal Fibronectin: NEGATIVE

## 2013-06-02 NOTE — MAU Provider Note (Signed)
History     CSN: [id number]  Arrival date and time: 06/02/13 1949   None     Chief Complaint  Patient presents with  . Contractions   HPI  Alexis Yang is a 32 y.o. G1P0 at [redacted]w[redacted]d who is here today for a FFN. She was seen on 05/31/13 for preterm contractions and her cervix was closed that time. However, we were unable to collect a FFN because she had a vaginal ultrasound in the office for cervical length. At that time Dr. Tamela Oddi recommended that she return to day for a FFN test. She is still feeling what she thinks are some occasional contractions.   Past Medical History  Diagnosis Date  . Sickle cell trait   . Infection     UTI  . Acne   . Abnormal Pap smear     colpo- ok since    Past Surgical History  Procedure Laterality Date  . Colposcopy    . No past surgeries      Family History  Problem Relation Age of Onset  . Hypertension Mother   . Cancer Maternal Grandmother     stomach  . Diabetes Maternal Grandfather   . Hypertension Maternal Grandfather   . Diabetes Maternal Aunt     History  Substance Use Topics  . Smoking status: Never Smoker   . Smokeless tobacco: Never Used  . Alcohol Use: Yes     Comment: occasional    Allergies: No Known Allergies  Prescriptions prior to admission  Medication Sig Dispense Refill  . calcium carbonate (TUMS - DOSED IN MG ELEMENTAL CALCIUM) 500 MG chewable tablet Chew 1 tablet by mouth daily as needed for heartburn.      . cholecalciferol (VITAMIN D) 1000 UNITS tablet Take 1,000 Units by mouth daily.      . metoCLOPramide (REGLAN) 10 MG tablet Take 1 tablet (10 mg total) by mouth 3 (three) times daily before meals.  90 tablet  2  . ondansetron (ZOFRAN ODT) 8 MG disintegrating tablet Take 1 tablet (8 mg total) by mouth every 12 (twelve) hours as needed for nausea.  30 tablet  5  . Prenatal Vit-Min-FA-Fish Oil ([employer] PRENATAL GUMMY PO) Take 2 tablets by mouth daily.        ROS Physical Exam   Blood pressure 109/66,  pulse 82, temperature 97.7 F (36.5 C), resp. rate 20, height 5\' 5"  (1.651 m), weight 69.31 kg (152 lb 12.8 oz), last menstrual period 12/01/2012.  Physical Exam  Nursing note and vitals reviewed. Constitutional: She is oriented to person, place, and time. She appears well-developed and well-nourished. No distress.  Cardiovascular: Normal rate.   Respiratory: Effort normal.  GI: Soft. There is no tenderness.  Genitourinary:  Cervix: closed/50/-1   Neurological: She is alert and oriented to person, place, and time.  Skin: Skin is warm and dry.  Psychiatric: She has a normal mood and affect.   FHT: CAT I Toco: 1 UC at the very beginning and none since.  MAU Course  Procedures  Results for orders placed during the hospital encounter of 06/02/13 (from the past 24 hour(s))  URINALYSIS, ROUTINE W REFLEX MICROSCOPIC     Status: Abnormal   Collection Time    06/02/13  8:09 PM      Result Value Range   Color, Urine YELLOW  YELLOW   APPearance CLEAR  CLEAR   Specific Gravity, Urine >1.030 (*) 1.005 - 1.030   pH 6.0  5.0 - 8.0   Glucose,  UA NEGATIVE  NEGATIVE mg/dL   Hgb urine dipstick NEGATIVE  NEGATIVE   Bilirubin Urine NEGATIVE  NEGATIVE   Ketones, ur 15 (*) NEGATIVE mg/dL   Protein, ur NEGATIVE  NEGATIVE mg/dL   Urobilinogen, UA 0.2  0.0 - 1.0 mg/dL   Nitrite NEGATIVE  NEGATIVE   Leukocytes, UA NEGATIVE  NEGATIVE  FETAL FIBRONECTIN     Status: None   Collection Time    06/02/13  9:20 PM      Result Value Range   Fetal Fibronectin NEGATIVE  NEGATIVE     Assessment and Plan   1. Braxton Hicks contractions    Negative FFN PTL danger signs reviewed at length FU with Dr. Tamela Oddi as planned Return to MAU as needed   Tawnya Crook 06/02/2013, 9:32 PM

## 2013-06-02 NOTE — MAU Note (Signed)
Was seen here Weds for ctxs. Was told to return for FFN. Was unable to do it Weds due to having vag u/s. Feeling some contractions

## 2013-06-07 ENCOUNTER — Other Ambulatory Visit: Payer: 59

## 2013-06-07 ENCOUNTER — Encounter: Payer: 59 | Admitting: Obstetrics & Gynecology

## 2013-06-09 ENCOUNTER — Encounter: Payer: 59 | Admitting: Obstetrics

## 2013-06-09 ENCOUNTER — Other Ambulatory Visit: Payer: 59

## 2013-06-12 ENCOUNTER — Other Ambulatory Visit: Payer: 59

## 2013-06-12 ENCOUNTER — Encounter: Payer: Self-pay | Admitting: Obstetrics

## 2013-06-12 ENCOUNTER — Ambulatory Visit (INDEPENDENT_AMBULATORY_CARE_PROVIDER_SITE_OTHER): Payer: 59 | Admitting: Obstetrics

## 2013-06-12 VITALS — BP 105/72 | Temp 98.5°F | Wt 153.0 lb

## 2013-06-12 DIAGNOSIS — Z3403 Encounter for supervision of normal first pregnancy, third trimester: Secondary | ICD-10-CM

## 2013-06-12 DIAGNOSIS — Z3402 Encounter for supervision of normal first pregnancy, second trimester: Secondary | ICD-10-CM

## 2013-06-12 DIAGNOSIS — Z3400 Encounter for supervision of normal first pregnancy, unspecified trimester: Secondary | ICD-10-CM

## 2013-06-12 LAB — POCT URINALYSIS DIPSTICK
Nitrite, UA: NEGATIVE
Protein, UA: NEGATIVE
Urobilinogen, UA: NEGATIVE
pH, UA: 6

## 2013-06-12 NOTE — Progress Notes (Signed)
P- 90 Pt states she has been experiencing some tightening in her abdomen a few times an hour.

## 2013-06-13 LAB — CBC
HCT: 35.4 % — ABNORMAL LOW (ref 36.0–46.0)
MCH: 24.5 pg — ABNORMAL LOW (ref 26.0–34.0)
MCHC: 32.5 g/dL (ref 30.0–36.0)
MCV: 75.5 fL — ABNORMAL LOW (ref 78.0–100.0)
RDW: 16.5 % — ABNORMAL HIGH (ref 11.5–15.5)

## 2013-06-27 ENCOUNTER — Ambulatory Visit (INDEPENDENT_AMBULATORY_CARE_PROVIDER_SITE_OTHER): Payer: 59 | Admitting: Advanced Practice Midwife

## 2013-06-27 VITALS — BP 118/85 | Temp 98.3°F | Wt 153.4 lb

## 2013-06-27 DIAGNOSIS — Z3400 Encounter for supervision of normal first pregnancy, unspecified trimester: Secondary | ICD-10-CM

## 2013-06-27 DIAGNOSIS — O26879 Cervical shortening, unspecified trimester: Secondary | ICD-10-CM | POA: Insufficient documentation

## 2013-06-27 DIAGNOSIS — Z3402 Encounter for supervision of normal first pregnancy, second trimester: Secondary | ICD-10-CM

## 2013-06-27 LAB — POCT URINALYSIS DIPSTICK: pH, UA: 5

## 2013-06-27 NOTE — Progress Notes (Signed)
Pulse-  95 

## 2013-06-27 NOTE — Progress Notes (Signed)
Routine Obstetrical Visit  Subjective:    Alexis Yang is being seen today for her routine obstetrical visit. She is at [redacted]w[redacted]d gestation.   Patient reports nonpainful contractions approx every hour thoughtout the day.   Patient questioning plan of care due to workup for preterm labor and shortened cervix.   Objective:     BP 118/85  Temp(Src) 98.3 F (36.8 C)  Wt 153 lb 6.4 oz (69.582 kg)  BMI 25.53 kg/m2  LMP 12/01/2012 FH 140 FH 29 Cervical exam deferred.    Assessment:    Pregnancy: G1P0 Patient Active Problem List   Diagnosis Date Noted  . Hyperemesis gravidarum 04/02/2013  . Protein-calorie malnutrition, severe 03/30/2013  . Sickle cell trait 03/06/2013  . Unspecified vitamin D deficiency 03/06/2013  . Supervision of normal first pregnancy 03/02/2013  . Condyloma acuminata of vulva in pregnancy 03/02/2013  Shortened Cervix (22mm), patient stable at this time.     Plan:     Prenatal vitamins. Problem list reviewed and updated.  Reviewed Cervical length 22 mm. fFN negative on 06/02/2013. Cervix was closed on exam. Follow up in 2 weeks. Patient report understanding to call if contractions become stronger or more frequent. Reviewed signs of PTL. Encouraged patient to rest frequently, stay well hydrated. Reviewed for patient to come to triage PRN or call with concerns.  80% of 20 min visit spent on counseling and coordination of care.     Bernadean Saling 06/27/2013

## 2013-07-06 ENCOUNTER — Encounter: Payer: Self-pay | Admitting: Obstetrics

## 2013-07-12 ENCOUNTER — Encounter: Payer: Self-pay | Admitting: Obstetrics & Gynecology

## 2013-07-12 ENCOUNTER — Ambulatory Visit (INDEPENDENT_AMBULATORY_CARE_PROVIDER_SITE_OTHER): Payer: 59 | Admitting: Obstetrics & Gynecology

## 2013-07-12 VITALS — BP 125/87 | Temp 98.1°F | Wt 162.0 lb

## 2013-07-12 DIAGNOSIS — Z3403 Encounter for supervision of normal first pregnancy, third trimester: Secondary | ICD-10-CM

## 2013-07-12 DIAGNOSIS — Z3400 Encounter for supervision of normal first pregnancy, unspecified trimester: Secondary | ICD-10-CM

## 2013-07-12 DIAGNOSIS — Z23 Encounter for immunization: Secondary | ICD-10-CM

## 2013-07-12 LAB — POCT URINALYSIS DIPSTICK
Bilirubin, UA: NEGATIVE
Blood, UA: NEGATIVE
Glucose, UA: NEGATIVE
Nitrite, UA: NEGATIVE

## 2013-07-12 NOTE — Progress Notes (Signed)
P 103         second BP check 166/82 P 98 Patient reports she is eating a lot of ice. Review of labs- 11.6

## 2013-07-13 ENCOUNTER — Encounter: Payer: Self-pay | Admitting: Obstetrics & Gynecology

## 2013-07-14 LAB — URINE CULTURE

## 2013-07-24 ENCOUNTER — Other Ambulatory Visit: Payer: Self-pay | Admitting: *Deleted

## 2013-07-24 DIAGNOSIS — O0990 Supervision of high risk pregnancy, unspecified, unspecified trimester: Secondary | ICD-10-CM

## 2013-07-26 ENCOUNTER — Ambulatory Visit (INDEPENDENT_AMBULATORY_CARE_PROVIDER_SITE_OTHER): Payer: 59 | Admitting: Obstetrics & Gynecology

## 2013-07-26 ENCOUNTER — Encounter: Payer: Self-pay | Admitting: Obstetrics & Gynecology

## 2013-07-26 VITALS — BP 117/79 | Temp 98.1°F | Wt 169.4 lb

## 2013-07-26 DIAGNOSIS — Z3403 Encounter for supervision of normal first pregnancy, third trimester: Secondary | ICD-10-CM

## 2013-07-26 DIAGNOSIS — Z3400 Encounter for supervision of normal first pregnancy, unspecified trimester: Secondary | ICD-10-CM

## 2013-07-26 LAB — POCT URINALYSIS DIPSTICK
Blood, UA: NEGATIVE
Protein, UA: NEGATIVE
Spec Grav, UA: 1.015
Urobilinogen, UA: NEGATIVE
pH, UA: 6

## 2013-07-26 NOTE — Patient Instructions (Signed)

## 2013-07-26 NOTE — Progress Notes (Signed)
Pulse 80 Doing well. 

## 2013-07-27 ENCOUNTER — Ambulatory Visit (HOSPITAL_COMMUNITY)
Admission: RE | Admit: 2013-07-27 | Discharge: 2013-07-27 | Disposition: A | Discharge status: Home / Self Care | Payer: 59 | Source: Ambulatory Visit | Attending: Obstetrics & Gynecology | Admitting: Obstetrics & Gynecology

## 2013-07-27 ENCOUNTER — Other Ambulatory Visit: Payer: Self-pay | Admitting: Obstetrics & Gynecology

## 2013-07-27 DIAGNOSIS — Z3689 Encounter for other specified antenatal screening: Secondary | ICD-10-CM | POA: Insufficient documentation

## 2013-07-27 DIAGNOSIS — O0990 Supervision of high risk pregnancy, unspecified, unspecified trimester: Secondary | ICD-10-CM

## 2013-08-01 ENCOUNTER — Other Ambulatory Visit: Payer: 59

## 2013-08-09 ENCOUNTER — Ambulatory Visit (INDEPENDENT_AMBULATORY_CARE_PROVIDER_SITE_OTHER): Payer: 59 | Admitting: Obstetrics & Gynecology

## 2013-08-09 ENCOUNTER — Encounter: Payer: Self-pay | Admitting: Obstetrics & Gynecology

## 2013-08-09 VITALS — BP 122/67 | Temp 98.4°F | Wt 175.4 lb

## 2013-08-09 DIAGNOSIS — Z3400 Encounter for supervision of normal first pregnancy, unspecified trimester: Secondary | ICD-10-CM

## 2013-08-09 DIAGNOSIS — Z3403 Encounter for supervision of normal first pregnancy, third trimester: Secondary | ICD-10-CM

## 2013-08-09 LAB — POCT URINALYSIS DIPSTICK
Blood, UA: NEGATIVE
Ketones, UA: NEGATIVE
Spec Grav, UA: 1.01
pH, UA: 6

## 2013-08-09 NOTE — Addendum Note (Signed)
Addended by: George Hugh on: 08/09/2013 02:55 PM   Modules accepted: Orders

## 2013-08-09 NOTE — Patient Instructions (Signed)
Genital Warts Genital warts are a sexually transmitted infection. They may appear as small bumps on the tissues of the genital area. CAUSES  Genital warts are caused by a virus called human papillomavirus (HPV). HPV is the most common sexually transmitted disease (STD) and infection of the sex organs. This infection is spread by having unprotected sex with an infected person. It can be spread by vaginal, anal, and oral sex. Many people do not know they are infected. They may be infected for years without problems. However, even if they do not have problems, they can unknowingly pass the infection to their sexual partners. SYMPTOMS   Itching and irritation in the genital area.  Warts that bleed.  Painful sexual intercourse. DIAGNOSIS  Warts are usually recognized with the naked eye on the vagina, vulva, perineum, anus, and rectum. Certain tests can also diagnose genital warts, such as:  A Pap test.  A tissue sample (biopsy) exam.  Colposcopy. A magnifying tool is used to examine the vagina and cervix. The HPV cells will change color when certain solutions are used. TREATMENT  Warts can be removed by:  Applying certain chemicals, such as cantharidin or podophyllin.  Liquid nitrogen freezing (cryotherapy).  Immunotherapy with candida or trichophyton injections.  Laser treatment.  Burning with an electrified probe (electrocautery).  Interferon injections.  Surgery. PREVENTION  HPV vaccination can help prevent HPV infections that cause genital warts and that cause cancer of the cervix. It is recommended that the vaccination be given to people between the ages 9 to 26 years old. The vaccine might not work as well or might not work at all if you already have HPV. It should not be given to pregnant women. HOME CARE INSTRUCTIONS   It is important to follow your caregiver's instructions. The warts will not go away without treatment. Repeat treatments are often needed to get rid of warts.  Even after it appears that the warts are gone, the normal tissue underneath often remains infected.  Do not try to treat genital warts with medicine used to treat hand warts. This type of medicine is strong and can burn the skin in the genital area, causing more damage.  Tell your past and current sexual partner(s) that you have genital warts. They may be infected also and need treatment.  Avoid sexual contact while being treated.  Do not touch or scratch the warts. The infection may spread to other parts of your body.  Women with genital warts should have a cervical cancer check (Pap test) at least once a year. This type of cancer is slow-growing and can be cured if found early. Chances of developing cervical cancer are increased with HPV.  Inform your obstetrician about your warts in the event of pregnancy. This virus can be passed to the baby's respiratory tract. Discuss this with your caregiver.  Use a condom during sexual intercourse. Following treatment, the use of condoms will help prevent reinfection.  Ask your caregiver about using over-the-counter anti-itch creams. SEEK MEDICAL CARE IF:   Your treated skin becomes red, swollen, or painful.  You have a fever.  You feel generally ill.  You feel little lumps in and around your genital area.  You are bleeding or have painful sexual intercourse. MAKE SURE YOU:   Understand these instructions.  Will watch your condition.  Will get help right away if you are not doing well or get worse. Document Released: 10/09/2000 Document Revised: 01/04/2012 Document Reviewed: 04/20/2011 [employer] Patient Information 2014 [employer].  

## 2013-08-09 NOTE — Progress Notes (Signed)
Pulse- 89.  Clear discharge for 3 days with an odor.  Doubt PPROM.

## 2013-08-17 ENCOUNTER — Encounter (HOSPITAL_COMMUNITY): Payer: Self-pay | Admitting: *Deleted

## 2013-08-17 ENCOUNTER — Ambulatory Visit (INDEPENDENT_AMBULATORY_CARE_PROVIDER_SITE_OTHER): Payer: 59 | Admitting: Obstetrics & Gynecology

## 2013-08-17 ENCOUNTER — Inpatient Hospital Stay (HOSPITAL_COMMUNITY)
Admission: AD | Admit: 2013-08-17 | Discharge: 2013-08-20 | DRG: 774 | Disposition: A | Discharge status: Home / Self Care | Payer: 59 | Source: Ambulatory Visit | Attending: Obstetrics | Admitting: Obstetrics

## 2013-08-17 VITALS — BP 137/94 | Wt 179.0 lb

## 2013-08-17 DIAGNOSIS — O2390 Unspecified genitourinary tract infection in pregnancy, unspecified trimester: Secondary | ICD-10-CM | POA: Diagnosis present

## 2013-08-17 DIAGNOSIS — E559 Vitamin D deficiency, unspecified: Secondary | ICD-10-CM

## 2013-08-17 DIAGNOSIS — O1410 Severe pre-eclampsia, unspecified trimester: Secondary | ICD-10-CM

## 2013-08-17 DIAGNOSIS — A630 Anogenital (venereal) warts: Secondary | ICD-10-CM

## 2013-08-17 DIAGNOSIS — O210 Mild hyperemesis gravidarum: Secondary | ICD-10-CM

## 2013-08-17 DIAGNOSIS — O1413 Severe pre-eclampsia, third trimester: Secondary | ICD-10-CM

## 2013-08-17 DIAGNOSIS — IMO0002 Reserved for concepts with insufficient information to code with codable children: Principal | ICD-10-CM | POA: Diagnosis present

## 2013-08-17 DIAGNOSIS — Z3402 Encounter for supervision of normal first pregnancy, second trimester: Secondary | ICD-10-CM

## 2013-08-17 DIAGNOSIS — Z3400 Encounter for supervision of normal first pregnancy, unspecified trimester: Secondary | ICD-10-CM

## 2013-08-17 DIAGNOSIS — O1493 Unspecified pre-eclampsia, third trimester: Secondary | ICD-10-CM

## 2013-08-17 DIAGNOSIS — N390 Urinary tract infection, site not specified: Secondary | ICD-10-CM | POA: Diagnosis present

## 2013-08-17 DIAGNOSIS — Z3403 Encounter for supervision of normal first pregnancy, third trimester: Secondary | ICD-10-CM

## 2013-08-17 DIAGNOSIS — D573 Sickle-cell trait: Secondary | ICD-10-CM

## 2013-08-17 HISTORY — DX: Gastro-esophageal reflux disease without esophagitis: K21.9

## 2013-08-17 HISTORY — DX: Other infections with a predominantly sexual mode of transmission complicating pregnancy, unspecified trimester: O98.319

## 2013-08-17 HISTORY — DX: Other infections with a predominantly sexual mode of transmission complicating pregnancy, unspecified trimester: A63.0

## 2013-08-17 LAB — COMPREHENSIVE METABOLIC PANEL
ALT: 13 U/L (ref 0–35)
CO2: 20 mEq/L (ref 19–32)
Calcium: 9 mg/dL (ref 8.4–10.5)
Chloride: 103 mEq/L (ref 96–112)
Creatinine, Ser: 0.71 mg/dL (ref 0.50–1.10)
GFR calc Af Amer: >90 mL/min (ref >90)
GFR calc non Af Amer: >90 mL/min (ref >90)
Glucose, Bld: 105 mg/dL — ABNORMAL HIGH (ref 70–99)
Sodium: 135 mEq/L (ref 135–145)
Total Bilirubin: 0.1 mg/dL — ABNORMAL LOW (ref 0.3–1.2)

## 2013-08-17 LAB — PROTEIN / CREATININE RATIO, URINE: Total Protein, Urine: 342 mg/dL

## 2013-08-17 LAB — URINE MICROSCOPIC-ADD ON

## 2013-08-17 LAB — URINALYSIS, ROUTINE W REFLEX MICROSCOPIC
Glucose, UA: NEGATIVE mg/dL
Hgb urine dipstick: NEGATIVE
Leukocytes, UA: NEGATIVE
Protein, ur: 100 mg/dL — AB
Specific Gravity, Urine: 1.025 (ref 1.005–1.030)
pH: 6 (ref 5.0–8.0)

## 2013-08-17 LAB — CBC
HCT: 33.5 % — ABNORMAL LOW (ref 36.0–46.0)
Hemoglobin: 10.6 g/dL — ABNORMAL LOW (ref 12.0–15.0)
MCH: 24.5 pg — ABNORMAL LOW (ref 26.0–34.0)
MCHC: 33.7 g/dL (ref 30.0–36.0)
MCV: 70.9 fL — ABNORMAL LOW (ref 78.0–100.0)
RBC: 4.33 MIL/uL (ref 3.87–5.11)
RDW: 21.5 % — ABNORMAL HIGH (ref 11.5–15.5)
WBC: 6.2 10*3/uL (ref 4.0–10.5)

## 2013-08-17 LAB — POCT URINALYSIS DIPSTICK
Bilirubin, UA: NEGATIVE
Blood, UA: NEGATIVE
Glucose, UA: NEGATIVE
Spec Grav, UA: 1.015

## 2013-08-17 MED ORDER — LIDOCAINE HCL (PF) 1 % IJ SOLN
30.0000 mL | INTRAMUSCULAR | Status: DC | PRN
Start: 2013-08-17 — End: 2013-08-20
  Administered 2013-08-18: 30 mL via SUBCUTANEOUS
  Filled 2013-08-17 (×2): qty 30

## 2013-08-17 MED ORDER — LACTATED RINGERS IV SOLN: 500.0000 mL | INTRAVENOUS | Status: DC | PRN

## 2013-08-17 MED ORDER — OXYTOCIN 40 UNITS IN LACTATED RINGERS INFUSION - SIMPLE MED: 62.5000 mL/h | INTRAVENOUS | Status: DC

## 2013-08-17 MED ORDER — OXYCODONE-ACETAMINOPHEN 5-325 MG PO TABS: 1.0000 | ORAL_TABLET | ORAL | Status: DC | PRN

## 2013-08-17 MED ORDER — ACETAMINOPHEN 325 MG PO TABS: 650.0000 mg | ORAL_TABLET | ORAL | Status: DC | PRN

## 2013-08-17 MED ORDER — CITRIC ACID-SODIUM CITRATE 334-500 MG/5ML PO SOLN: 30.0000 mL | ORAL | Status: DC | PRN

## 2013-08-17 MED ORDER — CALCIUM CARBONATE ANTACID 500 MG PO CHEW
2.0000 | CHEWABLE_TABLET | Freq: Once | ORAL | Status: AC
  Administered 2013-08-17: 400 mg via ORAL
  Filled 2013-08-17: qty 2

## 2013-08-17 MED ORDER — LACTATED RINGERS IV SOLN
INTRAVENOUS | Status: DC
  Administered 2013-08-17 – 2013-08-18 (×4): via INTRAVENOUS

## 2013-08-17 MED ORDER — TERBUTALINE SULFATE 1 MG/ML IJ SOLN: 0.2500 mg | Freq: Once | INTRAMUSCULAR | Status: AC | PRN

## 2013-08-17 MED ORDER — OXYTOCIN BOLUS FROM INFUSION
500.0000 mL | INTRAVENOUS | Status: DC
  Administered 2013-08-18: 500 mL via INTRAVENOUS

## 2013-08-17 MED ORDER — IBUPROFEN 600 MG PO TABS
600.0000 mg | ORAL_TABLET | Freq: Four times a day (QID) | ORAL | Status: DC | PRN
  Administered 2013-08-20: 600 mg via ORAL

## 2013-08-17 MED ORDER — MISOPROSTOL 25 MCG QUARTER TABLET
25.0000 ug | ORAL_TABLET | ORAL | Status: DC | PRN
  Administered 2013-08-17 – 2013-08-18 (×2): 25 ug via VAGINAL
  Filled 2013-08-17 (×2): qty 0.25
  Filled 2013-08-17: qty 1
  Filled 2013-08-17: qty 0.25

## 2013-08-17 MED ORDER — MAGNESIUM SULFATE BOLUS VIA INFUSION
4.0000 g | Freq: Once | INTRAVENOUS | Status: AC
  Administered 2013-08-18: 4 g via INTRAVENOUS
  Filled 2013-08-17: qty 500

## 2013-08-17 MED ORDER — ONDANSETRON HCL 4 MG/2ML IJ SOLN: 4.0000 mg | Freq: Four times a day (QID) | INTRAMUSCULAR | Status: DC | PRN

## 2013-08-17 MED ORDER — MAGNESIUM SULFATE 40 G IN LACTATED RINGERS - SIMPLE
2.0000 g/h | INTRAVENOUS | Status: DC
  Administered 2013-08-18: 2 g/h via INTRAVENOUS
  Filled 2013-08-17 (×2): qty 500

## 2013-08-17 NOTE — MAU Note (Signed)
PHARMACY IN ROOM

## 2013-08-17 NOTE — MAU Note (Signed)
PT SAYS SHE WAS AT OFFICE TODAY-  CHECKED BP-  ELEVATED, CHECKED URINE- HAD PROTEIN,   VE - 2 CM.     DENIES HSV AND MRSA.

## 2013-08-17 NOTE — MAU Provider Note (Signed)
History     CSN: [id number]  Arrival date and time: 08/17/13 1843   None     No chief complaint on file.  HPI This is a 32 y.o. female at [redacted]w[redacted]d who presents from office for PIH eval. Was seen there today and BP was elevated and there was protein in urine. Denies headache, abd pain or visual changes.    RN Note: PT SAYS SHE WAS AT OFFICE TODAY- CHECKED BP- ELEVATED, CHECKED URINE- HAD PROTEIN, VE - 2 CM. DENIES HSV AND MRSA.       OB History   Grav Para Term Preterm Abortions TAB SAB Ect Mult Living   1               Past Medical History  Diagnosis Date  . Sickle cell trait   . Infection     UTI  . Acne   . Abnormal Pap smear     colpo- ok since    Past Surgical History  Procedure Laterality Date  . Colposcopy    . No past surgeries      Family History  Problem Relation Age of Onset  . Hypertension Mother   . Cancer Maternal Grandmother     stomach  . Diabetes Maternal Grandfather   . Hypertension Maternal Grandfather   . Diabetes Maternal Aunt     History  Substance Use Topics  . Smoking status: Never Smoker   . Smokeless tobacco: Never Used  . Alcohol Use: Yes     Comment: occasional-    DRANK BEFORE  KNEW SHE WAS PREG.    Allergies: No Known Allergies  Prescriptions prior to admission  Medication Sig Dispense Refill  . ENSURE PLUS (ENSURE PLUS) LIQD Take 1 Can by mouth 3 (three) times daily between meals.      . metoCLOPramide (REGLAN) 10 MG tablet Take 1 tablet (10 mg total) by mouth 3 (three) times daily before meals.  90 tablet  2  . ondansetron (ZOFRAN ODT) 8 MG disintegrating tablet Take 1 tablet (8 mg total) by mouth every 12 (twelve) hours as needed for nausea.  30 tablet  5  . Prenatal Vit-Fe Fumarate-FA (PRENATAL MULTIVITAMIN) TABS tablet Take 1 tablet by mouth at bedtime.      . ranitidine (ZANTAC) 150 MG tablet Take 150 mg by mouth 2 (two) times daily as needed for heartburn.         Review of Systems  Constitutional: Negative for  fever and malaise/fatigue.  Eyes: Negative for blurred vision and double vision.  Gastrointestinal: Negative for nausea, vomiting, abdominal pain, diarrhea and constipation.  Neurological: Negative for dizziness, focal weakness and headaches.   Physical Exam   Blood pressure 153/99, pulse 103, temperature 98.9 F (37.2 C), temperature source Oral, height 5\' 7"  (1.702 m), weight 82.101 kg (181 lb), last menstrual period 12/01/2012.  Physical Exam  Constitutional: She is oriented to person, place, and time. She appears well-developed and well-nourished. No distress.  HENT:  Head: Normocephalic.  Cardiovascular: Normal rate.   Respiratory: Effort normal.  GI: Soft. She exhibits no distension. There is no tenderness. There is no rebound and no guarding.  Genitourinary:  FHR reactive Occasional contractions, not felt by pt  Musculoskeletal: Normal range of motion.  Neurological: She is alert and oriented to person, place, and time.  Skin: Skin is warm and dry.  Psychiatric: She has a normal mood and affect.    MAU Course  Procedures  MDM Preeclampsia labs ordered/drawn. They already recommended  IOL for her, but she wanted to be checked again  Assessment and Plan  Report to oncoming CNM  [facility] 08/17/2013, 7:40 PM   Care of pt assumed by Dorathy Kinsman, CNM at 2000.  Labs pending.  Results for orders placed during the hospital encounter of 08/17/13 (from the past 24 hour(s))  PROTEIN / CREATININE RATIO, URINE     Status: None   Collection Time    08/17/13  7:15 PM      Result Value Range   Creatinine, Urine 124.97     Total Protein, Urine PENDING     PROTEIN CREATININE RATIO PENDING  0.00 - 0.15  URINALYSIS, ROUTINE W REFLEX MICROSCOPIC     Status: Abnormal   Collection Time    08/17/13  7:15 PM      Result Value Range   Color, Urine YELLOW  YELLOW   APPearance CLEAR  CLEAR   Specific Gravity, Urine 1.025  1.005 - 1.030   pH 6.0  5.0 - 8.0   Glucose, UA  NEGATIVE  NEGATIVE mg/dL   Hgb urine dipstick NEGATIVE  NEGATIVE   Bilirubin Urine NEGATIVE  NEGATIVE   Ketones, ur NEGATIVE  NEGATIVE mg/dL   Protein, ur 161 (*) NEGATIVE mg/dL   Urobilinogen, UA 0.2  0.0 - 1.0 mg/dL   Nitrite NEGATIVE  NEGATIVE   Leukocytes, UA NEGATIVE  NEGATIVE  URINE MICROSCOPIC-ADD ON     Status: Abnormal   Collection Time    08/17/13  7:15 PM      Result Value Range   Squamous Epithelial / LPF MANY (*) RARE   WBC, UA 0-2  <3 WBC/hpf   RBC / HPF 0-2  <3 RBC/hpf   Bacteria, UA RARE  RARE  CBC     Status: Abnormal   Collection Time    08/17/13  7:50 PM      Result Value Range   WBC 6.2  4.0 - 10.5 K/uL   RBC 4.33  3.87 - 5.11 MIL/uL   Hemoglobin 10.6 (*) 12.0 - 15.0 g/dL   HCT 09.6 (*) 04.5 - 40.9 %   MCV 70.9 (*) 78.0 - 100.0 fL   MCH 24.5 (*) 26.0 - 34.0 pg   MCHC 34.5  30.0 - 36.0 g/dL   RDW 81.1 (*) 91.4 - 78.2 %   Platelets 161  150 - 400 K/uL  COMPREHENSIVE METABOLIC PANEL     Status: Abnormal   Collection Time    08/17/13  7:50 PM      Result Value Range   Sodium 135  135 - 145 mEq/L   Potassium 4.1  3.5 - 5.1 mEq/L   Chloride 103  96 - 112 mEq/L   CO2 20  19 - 32 mEq/L   Glucose, Bld 105 (*) 70 - 99 mg/dL   BUN 12  6 - 23 mg/dL   Creatinine, Ser 9.56  0.50 - 1.10 mg/dL   Calcium 9.0  8.4 - 21.3 mg/dL   Total Protein 5.6 (*) 6.0 - 8.3 g/dL   Albumin 2.3 (*) 3.5 - 5.2 g/dL   AST 19  0 - 37 U/L   ALT 13  0 - 35 U/L   Alkaline Phosphatase 199 (*) 39 - 117 U/L   Total Bilirubin 0.1 (*) 0.3 - 1.2 mg/dL   GFR calc non Af Amer >90  >90 mL/min   GFR calc Af Amer >90  >90 mL/min  LACTATE DEHYDROGENASE     Status: None   Collection Time  08/17/13  7:50 PM      Result Value Range   LDH 184  94 - 250 U/L  URIC ACID     Status: None   Collection Time    08/17/13  7:50 PM      Result Value Range   Uric Acid, Serum 5.8  2.4 - 7.0 mg/dL   MAU Course: Dr. Clearance Coots notified of BP's and labs. P:C still pending. Recommends IOL. Pt agrees. VE  1.5/70/-1, vtx  Assessment: 1. Preeclampsia, third trimester    Plan: Admit to BS Routine Orders. Dr. Clearance Coots assuming care of pt.  [place], CNM 08/17/2013 10:04 PM

## 2013-08-17 NOTE — H&P (Signed)
Alexis Yang is a 32 y.o. female presenting for elevated BP and proteinuria. Maternal Medical History:  Reason for admission: 32 yo G1.  EDC 09-07-13.  Seen in office today with elevated BP and proteinuria.  Sent to [facility] for further evaluation.  Fetal activity: Perceived fetal activity is normal.   Last perceived fetal movement was within the past hour.    Prenatal complications: PIH.     OB History   Grav Para Term Preterm Abortions TAB SAB Ect Mult Living   1              Past Medical History  Diagnosis Date  . Sickle cell trait   . Infection     UTI  . Acne   . Abnormal Pap smear     colpo- ok since   Past Surgical History  Procedure Laterality Date  . Colposcopy    . No past surgeries     Family History: family history includes Cancer in her maternal grandmother; Diabetes in her maternal aunt and maternal grandfather; Hypertension in her maternal grandfather and mother. Social History:  reports that she has never smoked. She has never used smokeless tobacco. She reports that she drinks alcohol. She reports that she does not use illicit drugs.   Prenatal Transfer Tool  Maternal Diabetes: No Genetic Screening: Normal Maternal Ultrasounds/Referrals: Normal Fetal Ultrasounds or other Referrals:  None Maternal Substance Abuse:  No Significant Maternal Medications:  None Significant Maternal Lab Results:  Lab values include: Group B Strep negative Other Comments:  None  Review of Systems  All other systems reviewed and are negative.      Blood pressure 143/88, pulse 74, temperature 98.9 F (37.2 C), temperature source Oral, height 5\' 7"  (1.702 m), weight 181 lb (82.101 kg), last menstrual period 12/01/2012. Maternal Exam:  Abdomen: Patient reports no abdominal tenderness. Fetal presentation: vertex  Pelvis: adequate for delivery.   Cervix: Cervix evaluated by digital exam.     Physical Exam  Nursing note and vitals reviewed. Constitutional: She is oriented  to person, place, and time. She appears well-developed and well-nourished.  HENT:  Head: Normocephalic and atraumatic.  Eyes: Conjunctivae are normal. Pupils are equal, round, and reactive to light.  Neck: Normal range of motion. Neck supple.  Cardiovascular: Normal rate and regular rhythm.   Respiratory: Effort normal and breath sounds normal.  GI: Soft.  Musculoskeletal: Normal range of motion.  Neurological: She is alert and oriented to person, place, and time.  Skin: Skin is warm and dry.  Psychiatric: She has a normal mood and affect. Her behavior is normal. Judgment and thought content normal.    Prenatal labs: ABO, Rh: O/POS/-- (05/08 1111) Antibody: NEG (05/08 1111) Rubella: 7.89 (05/08 1111) RPR: NON REAC (08/18 1016)  HBsAg: NEGATIVE (05/08 1111)  HIV: NON REACTIVE (08/18 1016)  GBS: NEGATIVE (10/15 1509)   Assessment/Plan: 37 weeks.  Preeclampsia.  Admit.   Stage IOL.   HARPER,CHARLES A 08/17/2013, 9:44 PM

## 2013-08-17 NOTE — Progress Notes (Signed)
Pulse-87 Pt states she is having 1-2 contractions per hour.   To [facility] for likely preeclampsia/IOL.

## 2013-08-18 ENCOUNTER — Encounter (HOSPITAL_COMMUNITY): Payer: Self-pay | Admitting: *Deleted

## 2013-08-18 ENCOUNTER — Inpatient Hospital Stay (HOSPITAL_COMMUNITY): Payer: 59 | Admitting: Anesthesiology

## 2013-08-18 ENCOUNTER — Encounter: Payer: Self-pay | Admitting: Obstetrics & Gynecology

## 2013-08-18 ENCOUNTER — Encounter (HOSPITAL_COMMUNITY): Payer: 59 | Admitting: Anesthesiology

## 2013-08-18 LAB — CBC
Hemoglobin: 11.3 g/dL — ABNORMAL LOW (ref 12.0–15.0)
MCV: 70.9 fL — ABNORMAL LOW (ref 78.0–100.0)
Platelets: 183 10*3/uL (ref 150–400)
RDW: 21.5 % — ABNORMAL HIGH (ref 11.5–15.5)
WBC: 7.5 10*3/uL (ref 4.0–10.5)

## 2013-08-18 LAB — US OB DETAIL + 14 WK

## 2013-08-18 LAB — RPR: RPR Ser Ql: NONREACTIVE

## 2013-08-18 MED ORDER — PHENYLEPHRINE 40 MCG/ML (10ML) SYRINGE FOR IV PUSH (FOR BLOOD PRESSURE SUPPORT)
80.0000 ug | PREFILLED_SYRINGE | INTRAVENOUS | Status: DC | PRN
  Filled 2013-08-18: qty 2
  Filled 2013-08-18: qty 5

## 2013-08-18 MED ORDER — BENZOCAINE-MENTHOL 20-0.5 % EX AERO
1.0000 "application " | INHALATION_SPRAY | CUTANEOUS | Status: DC | PRN
  Administered 2013-08-19: 1 via TOPICAL
  Filled 2013-08-18: qty 56

## 2013-08-18 MED ORDER — SIMETHICONE 80 MG PO CHEW: 80.0000 mg | CHEWABLE_TABLET | ORAL | Status: DC | PRN

## 2013-08-18 MED ORDER — PHENYLEPHRINE 40 MCG/ML (10ML) SYRINGE FOR IV PUSH (FOR BLOOD PRESSURE SUPPORT)
80.0000 ug | PREFILLED_SYRINGE | INTRAVENOUS | Status: DC | PRN
  Filled 2013-08-18: qty 2

## 2013-08-18 MED ORDER — SENNOSIDES-DOCUSATE SODIUM 8.6-50 MG PO TABS
2.0000 | ORAL_TABLET | ORAL | Status: DC
  Administered 2013-08-18 – 2013-08-19 (×2): 2 via ORAL
  Filled 2013-08-18 (×2): qty 2

## 2013-08-18 MED ORDER — ONDANSETRON HCL 4 MG/2ML IJ SOLN: 4.0000 mg | INTRAMUSCULAR | Status: DC | PRN

## 2013-08-18 MED ORDER — LIDOCAINE HCL (PF) 1 % IJ SOLN
INTRAMUSCULAR | Status: DC | PRN
  Administered 2013-08-18 (×4): 4 mL

## 2013-08-18 MED ORDER — EPHEDRINE 5 MG/ML INJ
10.0000 mg | INTRAVENOUS | Status: DC | PRN
  Filled 2013-08-18: qty 2

## 2013-08-18 MED ORDER — FENTANYL 2.5 MCG/ML BUPIVACAINE 1/10 % EPIDURAL INFUSION (WH - ANES)
14.0000 mL/h | INTRAMUSCULAR | Status: DC | PRN
  Administered 2013-08-18: 14 mL/h via EPIDURAL
  Filled 2013-08-18: qty 125

## 2013-08-18 MED ORDER — FENTANYL CITRATE 0.05 MG/ML IJ SOLN
INTRAMUSCULAR | Status: AC
  Administered 2013-08-18: 100 ug via INTRAVENOUS
  Filled 2013-08-18: qty 2

## 2013-08-18 MED ORDER — TETANUS-DIPHTH-ACELL PERTUSSIS 5-2.5-18.5 LF-MCG/0.5 IM SUSP
0.5000 mL | Freq: Once | INTRAMUSCULAR | Status: DC
  Filled 2013-08-18: qty 0.5

## 2013-08-18 MED ORDER — DIPHENHYDRAMINE HCL 25 MG PO CAPS: 25.0000 mg | ORAL_CAPSULE | Freq: Four times a day (QID) | ORAL | Status: DC | PRN

## 2013-08-18 MED ORDER — ZOLPIDEM TARTRATE 5 MG PO TABS: 5.0000 mg | ORAL_TABLET | Freq: Every evening | ORAL | Status: DC | PRN

## 2013-08-18 MED ORDER — OXYTOCIN 40 UNITS IN LACTATED RINGERS INFUSION - SIMPLE MED
1.0000 m[IU]/min | INTRAVENOUS | Status: DC
  Administered 2013-08-18: 2 m[IU]/min via INTRAVENOUS
  Administered 2013-08-18: 1 m[IU]/min via INTRAVENOUS
  Filled 2013-08-18: qty 1000

## 2013-08-18 MED ORDER — PRENATAL MULTIVITAMIN CH
1.0000 | ORAL_TABLET | Freq: Every day | ORAL | Status: DC
  Administered 2013-08-19 – 2013-08-20 (×2): 1 via ORAL
  Filled 2013-08-18 (×2): qty 1

## 2013-08-18 MED ORDER — OXYCODONE-ACETAMINOPHEN 5-325 MG PO TABS: 1.0000 | ORAL_TABLET | ORAL | Status: DC | PRN

## 2013-08-18 MED ORDER — LANOLIN HYDROUS EX OINT: TOPICAL_OINTMENT | CUTANEOUS | Status: DC | PRN

## 2013-08-18 MED ORDER — FERROUS SULFATE 325 (65 FE) MG PO TABS
325.0000 mg | ORAL_TABLET | Freq: Two times a day (BID) | ORAL | Status: DC
  Administered 2013-08-19 – 2013-08-20 (×2): 325 mg via ORAL
  Filled 2013-08-18 (×3): qty 1

## 2013-08-18 MED ORDER — IBUPROFEN 600 MG PO TABS
600.0000 mg | ORAL_TABLET | Freq: Four times a day (QID) | ORAL | Status: DC
  Administered 2013-08-18 – 2013-08-20 (×6): 600 mg via ORAL
  Filled 2013-08-18 (×7): qty 1

## 2013-08-18 MED ORDER — LACTATED RINGERS IV SOLN
500.0000 mL | Freq: Once | INTRAVENOUS | Status: AC
  Administered 2013-08-18: 500 mL via INTRAVENOUS

## 2013-08-18 MED ORDER — ONDANSETRON HCL 4 MG PO TABS: 4.0000 mg | ORAL_TABLET | ORAL | Status: DC | PRN

## 2013-08-18 MED ORDER — WITCH HAZEL-GLYCERIN EX PADS
1.0000 "application " | MEDICATED_PAD | CUTANEOUS | Status: DC | PRN
  Administered 2013-08-19: 1 via TOPICAL

## 2013-08-18 MED ORDER — EPHEDRINE 5 MG/ML INJ
10.0000 mg | INTRAVENOUS | Status: DC | PRN
  Filled 2013-08-18: qty 4
  Filled 2013-08-18: qty 2

## 2013-08-18 MED ORDER — DIPHENHYDRAMINE HCL 50 MG/ML IJ SOLN: 12.5000 mg | INTRAMUSCULAR | Status: DC | PRN

## 2013-08-18 MED ORDER — FENTANYL CITRATE 0.05 MG/ML IJ SOLN
100.0000 ug | INTRAMUSCULAR | Status: DC | PRN
  Administered 2013-08-18: 100 ug via INTRAVENOUS

## 2013-08-18 MED ORDER — DIBUCAINE 1 % RE OINT
1.0000 "application " | TOPICAL_OINTMENT | RECTAL | Status: DC | PRN
  Filled 2013-08-18: qty 28

## 2013-08-18 NOTE — Anesthesia Preprocedure Evaluation (Addendum)
Anesthesia Evaluation  Patient identified by MRN, date of birth, ID band Patient awake    Reviewed: Allergy & Precautions, H&P , NPO status , Patient's Chart, lab work & pertinent test results, reviewed documented beta blocker date and time   History of Anesthesia Complications Negative for: history of anesthetic complications  Airway Mallampati: III TM Distance: >3 FB Neck ROM: full    Dental  (+) Teeth Intact   Pulmonary neg pulmonary ROS,  breath sounds clear to auscultation        Cardiovascular hypertension (preeclampsia on magnesium), Rhythm:regular Rate:Normal     Neuro/Psych negative neurological ROS  negative psych ROS   GI/Hepatic Neg liver ROS, GERD-  Medicated,  Endo/Other  negative endocrine ROS  Renal/GU negative Renal ROS     Musculoskeletal   Abdominal   Peds  Hematology  (+) Sickle cell trait ,   Anesthesia Other Findings   Reproductive/Obstetrics (+) Pregnancy                          Anesthesia Physical Anesthesia Plan  ASA: II  Anesthesia Plan: Epidural   Post-op Pain Management:    Induction:   Airway Management Planned:   Additional Equipment:   Intra-op Plan:   Post-operative Plan:   Informed Consent: I have reviewed the patients History and Physical, chart, labs and discussed the procedure including the risks, benefits and alternatives for the proposed anesthesia with the patient or authorized representative who has indicated his/her understanding and acceptance.     Plan Discussed with:   Anesthesia Plan Comments:         Anesthesia Quick Evaluation

## 2013-08-18 NOTE — Patient Instructions (Signed)

## 2013-08-18 NOTE — Anesthesia Procedure Notes (Signed)
Epidural Patient location during procedure: OB Start time: 08/18/2013 4:01 PM  Staffing Performed by: anesthesiologist   Preanesthetic Checklist Completed: patient identified, site marked, surgical consent, pre-op evaluation, timeout performed, IV checked, risks and benefits discussed and monitors and equipment checked  Epidural Patient position: sitting Prep: site prepped and draped and DuraPrep Patient monitoring: continuous pulse ox and blood pressure Approach: midline Injection technique: LOR air  Needle:  Needle type: Tuohy  Needle gauge: 17 G Needle length: 9 cm and 9 Needle insertion depth: 6 cm Catheter type: closed end flexible Catheter size: 19 Gauge Catheter at skin depth: 11 cm Test dose: negative  Assessment Events: blood not aspirated, injection not painful, no injection resistance, negative IV test and no paresthesia  Additional Notes Discussed risk of headache, infection, bleeding, nerve injury and failed or incomplete block.  Patient voices understanding and wishes to proceed.  Epidural placed easily on first attempt.  No paresthesia.  Patient tolerated procedure well with no apparent complications.  Jasmine December, MDReason for block:procedure for pain

## 2013-08-18 NOTE — Progress Notes (Addendum)
Alexis Yang is a 32 y.o. G1P0 at [redacted]w[redacted]d by LMP admitted for induction of labor due to Pre-eclamptic toxemia of pregnancy..  Subjective:  Patient appears comfortable and not in active labor. Doing well. No concerns. Denies HA, RUQ pain, vision changes. Expecting a boy Montez Morita. Desires natural childbirth.   Objective: BP 144/95  Pulse 76  Temp(Src) 98 F (36.7 C) (Oral)  Resp 18  Ht 5\' 7"  (1.702 m)  Wt 181 lb (82.101 kg)  BMI 28.34 kg/m2  LMP 12/01/2012 I/O last 3 completed shifts: In: 1399.1 [P.O.:360; I.V.:1039.1] Out: 1450 [Urine:1450] Total I/O In: 250 [I.V.:250] Out: 750 [Urine:750]  Reflexes +2, Clonus absent  FHT:  FHR: 130 bpm, variability: absent,  accelerations:  Present,  decelerations:  Absent UC:   irregular, every 5-10 minutes SVE:   Dilation: 3 Effacement (%): 80 Station: -1;-2 Exam by:: Naylee Frankowski, cnm  Labs: Lab Results  Component Value Date   WBC 6.4 08/17/2013   HGB 11.3* 08/17/2013   HCT 33.5* 08/17/2013   MCV 70.8* 08/17/2013   PLT 186 08/17/2013    Assessment / Plan: Induction of labor due to preeclampsia,  progressing well. Last dose of cytotec @ 0300. Start pitocin this am after breakfast.  Labor: Progressing normally Preeclampsia:  on magnesium sulfate Fetal Wellbeing:  Category I Pain Control:  Labor support without medications I/D:  n/a Anticipated MOD:  NSVD  Nic Lampe 08/18/2013, 9:22 AM

## 2013-08-18 NOTE — Progress Notes (Signed)
Pt up to BR. Pericare done. Pt became light headed and stated she felt faint. Pt back to bed via stedy.  Given apple jiuce. Pt stating she feels much better.

## 2013-08-18 NOTE — Progress Notes (Signed)
Accidentally charted under wrong time

## 2013-08-18 NOTE — Progress Notes (Signed)
Alexis Yang is a 32 y.o. G1P0 at [redacted]w[redacted]d by LMP admitted for induction of labor due to Pre-eclamptic toxemia of pregnancy.  Subjective: Patient doing well. Aware of plan of care, no questions. FOC supportive and at bedside.  Objective: BP 128/109  Pulse 90  Temp(Src) 98.3 F (36.8 C) (Axillary)  Resp 20  Ht 5\' 7"  (1.702 m)  Wt 181 lb (82.101 kg)  BMI 28.34 kg/m2  LMP 12/01/2012 I/O last 3 completed shifts: In: 1399.1 [P.O.:360; I.V.:1039.1] Out: 1450 [Urine:1450] Total I/O In: 1490.9 [P.O.:420; I.V.:1070.9] Out: 1050 [Urine:1050]  FHT:  FHR: 145 bpm, variability: moderate,  accelerations:  Present,  decelerations:  Absent UC:   regular, every 2-4 minutes SVE:   Dilation: 3 Effacement (%): 80 Station: -2;-1 Exam by:: Alexis Yang  AROM clear fluid, moderate amount  Labs: Lab Results  Component Value Date   WBC 7.5 08/18/2013   HGB 11.3* 08/18/2013   HCT 32.7* 08/18/2013   MCV 70.9* 08/18/2013   PLT 183 08/18/2013    Assessment / Plan: Induction of labor due to preeclampsia,  progressing well on pitocin AROM per MD Harper's recommendations   Labor: Progressing on Pitocin, will continue to increase then AROM Preeclampsia:  on magnesium sulfate Fetal Wellbeing:  Category I Pain Control:  Labor support without medications I/D:  n/a Anticipated MOD:  NSVD  Alexis Yang 08/18/2013, 4:05 PM

## 2013-08-19 ENCOUNTER — Encounter (HOSPITAL_COMMUNITY): Payer: Self-pay | Admitting: *Deleted

## 2013-08-19 LAB — URINALYSIS, ROUTINE W REFLEX MICROSCOPIC
Bilirubin Urine: NEGATIVE
Glucose, UA: NEGATIVE mg/dL
Nitrite: NEGATIVE
Specific Gravity, Urine: >1.03 — ABNORMAL HIGH (ref 1.005–1.030)
pH: 6 (ref 5.0–8.0)

## 2013-08-19 LAB — CBC
HCT: 26.6 % — ABNORMAL LOW (ref 36.0–46.0)
MCH: 24.4 pg — ABNORMAL LOW (ref 26.0–34.0)
MCV: 70.6 fL — ABNORMAL LOW (ref 78.0–100.0)
Platelets: 162 10*3/uL (ref 150–400)
RBC: 3.77 MIL/uL — ABNORMAL LOW (ref 3.87–5.11)
RDW: 21.5 % — ABNORMAL HIGH (ref 11.5–15.5)

## 2013-08-19 MED ORDER — LACTATED RINGERS IV SOLN
INTRAVENOUS | Status: DC
  Administered 2013-08-19: 09:00:00 via INTRAVENOUS

## 2013-08-19 MED ORDER — LABETALOL HCL 200 MG PO TABS
200.0000 mg | ORAL_TABLET | Freq: Three times a day (TID) | ORAL | Status: DC
  Administered 2013-08-19 – 2013-08-20 (×3): 200 mg via ORAL
  Filled 2013-08-19 (×6): qty 1

## 2013-08-19 MED ORDER — NITROFURANTOIN MONOHYD MACRO 100 MG PO CAPS
100.0000 mg | ORAL_CAPSULE | Freq: Two times a day (BID) | ORAL | Status: DC
  Administered 2013-08-19: 100 mg via ORAL
  Filled 2013-08-19 (×4): qty 1

## 2013-08-19 NOTE — Anesthesia Postprocedure Evaluation (Signed)
Anesthesia Post Note  Patient: Alexis Yang  Procedure(s) Performed: * No procedures listed *  Anesthesia type: Epidural  Patient location: Mother/Baby  Post pain: Pain level controlled  Post assessment: Post-op Vital signs reviewed  Last Vitals: BP 122/77  Pulse 95  Temp(Src) 36.9 C (Oral)  Resp 18  Ht 5\' 7"  (1.702 m)  Wt 171 lb 1.6 oz (77.61 kg)  BMI 26.79 kg/m2  SpO2 98%  LMP 12/01/2012  Post vital signs: Reviewed  Level of consciousness: awake  Complications: No apparent anesthesia complications

## 2013-08-19 NOTE — Progress Notes (Signed)
Patient ID: Alexis Yang, female   DOB: 1980/11/06, 32 y.o.   MRN: [id number] Postpartum day one Blood pressure is normal still on her magnesium sulfate until 7 PM tonight Output good Fundus firm legs negative doing well

## 2013-08-19 NOTE — Lactation Note (Signed)
This note was copied from the chart of Alexis Special Ranes. Lactation Consultation Note: Initial visit with mom. She is sleepy and reports that baby has been nursing well- latching on with no pain. Baby asleep in bassinet. No questions at present. BF brochure given with resources for support after DC. To call prn  Patient Name: Alexis Yang ZOXWR'U Date: 08/19/2013 Reason for consult: Initial assessment   Maternal Data Formula Feeding for Exclusion: Yes Reason for exclusion: Admission to Intensive Care Unit (ICU) post-partum Infant to breast within first hour of birth: Yes Has patient been taught Hand Expression?: No Does the patient have breastfeeding experience prior to this delivery?: No  Feeding Feeding Type: Breast Fed  LATCH Score/Interventions                      Lactation Tools Discussed/Used     Consult Status Consult Status: Follow-up Date: 08/20/13 Follow-up type: In-patient    Pamelia Hoit 08/19/2013, 2:17 PM

## 2013-08-20 NOTE — Lactation Note (Signed)
This note was copied from the chart of Alexis Alora Gorey. Lactation Consultation Note: Follow up visit with mom, She reports that she gave a little formula during the night because he was so hungry. Reports that sh ei shaving trouble getting him to latch. Unwrapped and undressed awakening and latch latched well. No questions at present. Reviewed BFSG and OP appointments as resources for support after DC.To call prn  Patient Name: Alexis Yang ZOXWR'U Date: 08/20/2013 Reason for consult: Follow-up assessment   Maternal Data    Feeding Feeding Type: Breast Fed  LATCH Score/Interventions Latch: Grasps breast easily, tongue down, lips flanged, rhythmical sucking.  Audible Swallowing: A few with stimulation  Type of Nipple: Everted at rest and after stimulation  Comfort (Breast/Nipple): Soft / non-tender     Hold (Positioning): Assistance needed to correctly position infant at breast and maintain latch. Intervention(s): Breastfeeding basics reviewed;Support Pillows  LATCH Score: 8  Lactation Tools Discussed/Used     Consult Status Consult Status: Complete    Pamelia Hoit 08/20/2013, 11:34 AM

## 2013-08-20 NOTE — Progress Notes (Signed)
Patient ID: Alexis Yang, female   DOB: 1981-02-03, 32 y.o.   MRN: [id number] Postpartum day 2 Vital signs normal Fundus firm Legs negative Home today on Procardia 200 mg every 8 hours to see [facility] in one week for blood pressure check

## 2013-08-20 NOTE — Discharge Summary (Signed)
Obstetric Discharge Summary Reason for Admission: induction of labor Prenatal Procedures: Preeclampsia Intrapartum Procedures: spontaneous vaginal delivery Postpartum Procedures: none Complications-Operative and Postpartum: none Hemoglobin  Date Value Range Status  08/19/2013 9.2* 12.0 - 15.0 g/dL Final     REPEATED TO VERIFY     DELTA CHECK NOTED     HCT  Date Value Range Status  08/19/2013 26.6* 36.0 - 46.0 % Final    Physical Exam:  General: alert Lochia: appropriate Uterine Fundus: firm Incision: healing well DVT Evaluation: No evidence of DVT seen on physical exam.  Discharge Diagnoses: Term Pregnancy-delivered  Discharge Information: Date: 08/20/2013 Activity: pelvic rest Diet: routine Medications: Percocet Condition: stable Instructions: refer to practice specific booklet Discharge to: home Follow-up Information   Schedule an appointment as soon as possible for a visit in 1 week to follow up.      Newborn Data: Live born female  Birth Weight: 6 lb 10 oz (3005 g) APGAR: 9, 9  Home with mother.  MARSHALL,BERNARD A 08/20/2013, 7:42 AM

## 2013-08-20 NOTE — Progress Notes (Signed)
Patient ID: Alexis Yang, female   DOB: [birth year], 32 y.o.   MRN: [id number] Her urine also showed that she had a urinary tract infection she was started on Macrobid 100 mg by mouth twice a day for 6 days and

## 2013-08-20 NOTE — Progress Notes (Signed)
U/A and urine C&S sent to lab

## 2013-08-21 ENCOUNTER — Other Ambulatory Visit: Payer: Self-pay | Admitting: *Deleted

## 2013-08-21 MED ORDER — PRAMOXINE HCL 1 % RE FOAM
Freq: Two times a day (BID) | RECTAL | Status: DC
Start: 2013-08-21 — End: 2015-04-12

## 2013-08-22 LAB — URINE CULTURE
Colony Count: >=100000
Special Requests: NORMAL

## 2013-08-24 ENCOUNTER — Encounter: Payer: Self-pay | Admitting: Obstetrics & Gynecology

## 2013-08-24 ENCOUNTER — Ambulatory Visit (INDEPENDENT_AMBULATORY_CARE_PROVIDER_SITE_OTHER): Payer: 59 | Admitting: Obstetrics & Gynecology

## 2013-08-24 VITALS — BP 153/112 | HR 98 | Temp 98.8°F | Wt 166.0 lb

## 2013-08-24 DIAGNOSIS — R030 Elevated blood-pressure reading, without diagnosis of hypertension: Secondary | ICD-10-CM

## 2013-08-24 DIAGNOSIS — O139 Gestational [pregnancy-induced] hypertension without significant proteinuria, unspecified trimester: Secondary | ICD-10-CM | POA: Insufficient documentation

## 2013-08-24 DIAGNOSIS — O135 Gestational [pregnancy-induced] hypertension without significant proteinuria, complicating the puerperium: Secondary | ICD-10-CM

## 2013-08-24 DIAGNOSIS — O133 Gestational [pregnancy-induced] hypertension without significant proteinuria, third trimester: Secondary | ICD-10-CM

## 2013-08-24 LAB — POCT URINALYSIS DIPSTICK
Bilirubin, UA: NEGATIVE
Ketones, UA: NEGATIVE
Nitrite, UA: NEGATIVE
Spec Grav, UA: 1.02
pH, UA: 5

## 2013-08-24 LAB — CBC
HCT: 27.6 % — ABNORMAL LOW (ref 36.0–46.0)
Hemoglobin: 9.3 g/dL — ABNORMAL LOW (ref 12.0–15.0)
MCV: 71.9 fL — ABNORMAL LOW (ref 78.0–100.0)
Platelets: 307 10*3/uL (ref 150–400)
RBC: 3.84 MIL/uL — ABNORMAL LOW (ref 3.87–5.11)
RDW: 22.8 % — ABNORMAL HIGH (ref 11.5–15.5)
WBC: 7.8 10*3/uL (ref 4.0–10.5)

## 2013-08-24 LAB — CREATININE, SERUM: Creat: 0.86 mg/dL (ref 0.50–1.10)

## 2013-08-24 LAB — AST: AST: 89 U/L — ABNORMAL HIGH (ref 0–37)

## 2013-08-24 NOTE — Patient Instructions (Signed)

## 2013-08-24 NOTE — Progress Notes (Signed)
Subjective:     Alexis Yang is a 32 y.o. female who presents for a postpartum visit. She is 1 week postpartum following a spontaneous vaginal delivery. I have fully reviewed the prenatal and intrapartum course. The delivery was at 37.1 gestational weeks. Outcome: spontaneous vaginal delivery. Anesthesia: epidural. Postpartum course has been normal. Baby's course has been normal. Baby is feeding by breast. Bleeding thin lochia. Bowel function is normal. Bladder function is normal. Patient is not sexually active. Contraception method is abstinence. Postpartum depression screening: negative. Second BP check 147/104      Per Dr Tamela Oddi- 400mg  bid, PIH labs and return in 1 week. The following portions of the patient's history were reviewed and updated as appropriate: allergies, current medications, past family history, past medical history, past social history, past surgical history and problem list.  Review of Systems Pertinent items are noted in HPI.   Objective:    BP 153/112  Pulse 98  Temp(Src) 98.8 F (37.1 C)  Wt 166 lb (75.297 kg)  BMI 25.99 kg/m2  LMP 12/01/2012  Breastfeeding? Yes

## 2013-08-25 LAB — PATHOLOGIST SMEAR REVIEW

## 2013-08-25 LAB — PROTEIN / CREATININE RATIO, URINE
Creatinine, Urine: 249.5 mg/dL
Protein Creatinine Ratio: 1.24 — ABNORMAL HIGH (ref <0.15)
Total Protein, Urine: 310 mg/dL

## 2013-08-28 ENCOUNTER — Ambulatory Visit: Payer: 59 | Admitting: Obstetrics & Gynecology

## 2013-08-31 ENCOUNTER — Ambulatory Visit: Payer: 59 | Admitting: Obstetrics & Gynecology

## 2013-08-31 ENCOUNTER — Encounter: Payer: Self-pay | Admitting: Obstetrics & Gynecology

## 2013-08-31 ENCOUNTER — Ambulatory Visit (INDEPENDENT_AMBULATORY_CARE_PROVIDER_SITE_OTHER): Payer: 59 | Admitting: Obstetrics & Gynecology

## 2013-08-31 VITALS — BP 116/83 | HR 101 | Temp 98.3°F | Ht 67.0 in | Wt 159.6 lb

## 2013-08-31 DIAGNOSIS — O133 Gestational [pregnancy-induced] hypertension without significant proteinuria, third trimester: Secondary | ICD-10-CM

## 2013-08-31 DIAGNOSIS — O135 Gestational [pregnancy-induced] hypertension without significant proteinuria, complicating the puerperium: Secondary | ICD-10-CM

## 2013-08-31 NOTE — Progress Notes (Signed)
.   Subjective:     Alexis Yang is a 32 y.o. female who presents for a postpartum visit. She is 2 weeks postpartum following a spontaneous vaginal delivery. I have fully reviewed the prenatal and intrapartum course. The delivery was at 37 gestational weeks. Outcome: spontaneous vaginal delivery. Anesthesia: epidural. Postpartum course has been normal. Baby's course has been normal. Baby is feeding by breast. Bleeding staining only. Bowel function is normal. Bladder function is normal. Patient is not sexually active. Contraception method is none. Postpartum depression screening: negative.  The following portions of the patient's history were reviewed and updated as appropriate: allergies, current medications, past family history, past medical history, past social history, past surgical history and problem list.  Review of Systems Pertinent items are noted in HPI.   Objective:    BP 116/83  Pulse 101  Temp(Src) 98.3 F (36.8 C) (Oral)  Ht 5\' 7"  (1.702 m)  Wt 159 lb 9.6 oz (72.394 kg)  BMI 24.99 kg/m2  LMP 12/01/2012  Breastfeeding? Yes        Assessment:   H/O PIH--B/Ps OK today  Plan:   Decrease labetalol-->200 mg BID Continue to monitor B/P at home Return in 1 week for B/P check; if wnl--> d/c labetalol/monitor B/P periodically

## 2013-09-01 NOTE — Patient Instructions (Signed)

## 2013-09-28 ENCOUNTER — Encounter: Payer: Self-pay | Admitting: Obstetrics & Gynecology

## 2013-09-28 ENCOUNTER — Ambulatory Visit (INDEPENDENT_AMBULATORY_CARE_PROVIDER_SITE_OTHER): Payer: 59 | Admitting: Obstetrics & Gynecology

## 2013-09-28 VITALS — BP 124/85 | HR 80 | Wt 156.0 lb

## 2013-09-28 DIAGNOSIS — N760 Acute vaginitis: Secondary | ICD-10-CM

## 2013-09-28 DIAGNOSIS — A630 Anogenital (venereal) warts: Secondary | ICD-10-CM

## 2013-09-28 NOTE — Progress Notes (Signed)
  Subjective:     Alexis Yang is a 32 y.o. female who presents for a postpartum visit. She is 6 weeks postpartum following a spontaneous vaginal delivery. I have fully reviewed the prenatal and intrapartum course. The delivery was at 37 gestational weeks. Outcome: spontaneous vaginal delivery. Anesthesia: epidural. Postpartum course has been going well. Baby's course has been going well. Baby is feeding by breast. Bleeding no bleeding. Bowel function is normal.  Pt states that she is concerned about her hemorroids.  Bladder function is normal. Pt states that she is having some malodorous discharge.  Think she may have a UTI. Patient is not sexually active. Contraception method is none. Postpartum depression screening: negative.  The following portions of the patient's history were reviewed and updated as appropriate: allergies, current medications, past family history, past medical history, past social history, past surgical history and problem list.  Review of Systems Pertinent items are noted in HPI.   Objective:    BP 124/85  Pulse 80  Wt 156 lb (70.761 kg)  Breastfeeding? Yes        General:  alert     Abdomen: soft, non-tender; bowel sounds normal; no masses,  no organomegaly   Vulva:  multiple verrucous lesions < 1 cm  Vagina: normal vagina  Cervix:  no lesions  Corpus: normal size, contour, position, consistency, mobility, non-tender  Adnexa:  normal adnexa   Assessment:   Condyloma accuminatum H/O vitamin D deficiency  Plan:    1. Contraception: OCP (estrogen/progesterone) 2. Continue vit D supplement 3. Follow up in: 2 months   D/C labetalol

## 2013-09-29 LAB — WET PREP BY MOLECULAR PROBE: Gardnerella vaginalis: POSITIVE — AB

## 2013-10-02 ENCOUNTER — Encounter: Payer: Self-pay | Admitting: Obstetrics & Gynecology

## 2013-10-02 DIAGNOSIS — A630 Anogenital (venereal) warts: Secondary | ICD-10-CM | POA: Insufficient documentation

## 2013-10-16 ENCOUNTER — Other Ambulatory Visit: Payer: Self-pay | Admitting: *Deleted

## 2013-10-16 DIAGNOSIS — B9689 Other specified bacterial agents as the cause of diseases classified elsewhere: Secondary | ICD-10-CM

## 2013-10-16 MED ORDER — METRONIDAZOLE 500 MG PO TABS: 500.0000 mg | ORAL_TABLET | Freq: Two times a day (BID) | ORAL | Status: DC

## 2013-11-27 ENCOUNTER — Encounter: Payer: Self-pay | Admitting: Obstetrics & Gynecology

## 2013-11-30 ENCOUNTER — Ambulatory Visit: Payer: 59 | Admitting: Obstetrics & Gynecology

## 2014-08-27 ENCOUNTER — Encounter: Payer: Self-pay | Admitting: Obstetrics & Gynecology

## 2014-10-22 ENCOUNTER — Encounter: Payer: Self-pay | Admitting: *Deleted

## 2014-10-23 ENCOUNTER — Encounter: Payer: Self-pay | Admitting: Obstetrics & Gynecology

## 2014-10-23 ENCOUNTER — Ambulatory Visit: Payer: 59 | Admitting: Obstetrics

## 2014-10-23 ENCOUNTER — Telehealth: Payer: Self-pay | Admitting: *Deleted

## 2014-10-23 NOTE — Telephone Encounter (Signed)
Pt called to office requesting letter.  Pt states that she was sick during her pregnancy and was unable to complete semester at school.  Pt would like to know if we could compose letter for school to excuse her.  Return call to pt.  LM on VM to contact office.

## 2014-10-25 ENCOUNTER — Telehealth: Payer: Self-pay | Admitting: *Deleted

## 2014-10-25 NOTE — Telephone Encounter (Signed)
Attempt to contact pt regarding letter she is wanting office to compose. No answer, LM on VM to contact office.

## 2014-10-29 ENCOUNTER — Encounter: Payer: Self-pay | Admitting: *Deleted

## 2014-11-09 NOTE — Telephone Encounter (Signed)
Letter has been sent

## 2014-12-24 ENCOUNTER — Telehealth: Payer: Self-pay | Admitting: *Deleted

## 2014-12-24 NOTE — Telephone Encounter (Signed)
Patient is calling requesting birth control pills until she can see another provider. Per patient chart- we have not ever Rx'd birth control for the patient. 2:30 LM on VM to CB

## 2014-12-24 NOTE — Telephone Encounter (Signed)
Patient called back- she is not using birth control at this time, but needs it. She has used the ortho cyclen lo in the past. Do we want to Rx or should she have an appointment first?

## 2015-04-12 ENCOUNTER — Ambulatory Visit (INDEPENDENT_AMBULATORY_CARE_PROVIDER_SITE_OTHER): Payer: 59 | Admitting: Certified Nurse Midwife

## 2015-04-12 ENCOUNTER — Encounter: Payer: Self-pay | Admitting: Certified Nurse Midwife

## 2015-04-12 VITALS — BP 120/81 | HR 66 | Temp 98.5°F | Ht 67.0 in | Wt 159.0 lb

## 2015-04-12 DIAGNOSIS — Z01419 Encounter for gynecological examination (general) (routine) without abnormal findings: Secondary | ICD-10-CM

## 2015-04-12 DIAGNOSIS — Z30011 Encounter for initial prescription of contraceptive pills: Secondary | ICD-10-CM | POA: Diagnosis not present

## 2015-04-12 DIAGNOSIS — K641 Second degree hemorrhoids: Secondary | ICD-10-CM

## 2015-04-12 DIAGNOSIS — B3731 Acute candidiasis of vulva and vagina: Secondary | ICD-10-CM

## 2015-04-12 DIAGNOSIS — B373 Candidiasis of vulva and vagina: Secondary | ICD-10-CM | POA: Diagnosis not present

## 2015-04-12 MED ORDER — FLUCONAZOLE 100 MG PO TABS: 100.0000 mg | ORAL_TABLET | Freq: Once | ORAL | Status: AC

## 2015-04-12 MED ORDER — HYDROCORTISONE ACETATE 25 MG RE SUPP: 25.0000 mg | Freq: Two times a day (BID) | RECTAL | Status: AC

## 2015-04-12 MED ORDER — NORETHIN-ETH ESTRAD-FE BIPHAS 1 MG-10 MCG / 10 MCG PO TABS: 1.0000 | ORAL_TABLET | Freq: Every day | ORAL | Status: AC

## 2015-04-12 NOTE — Progress Notes (Signed)
Patient ID: Alexis Yang, female   DOB: Jul 06, 1981, 34 y.o.   MRN: [id number]   Subjective:     Alexis Yang is a 34 y.o. female here for a routine exam.  Current complaints: none.  Desires birth control.  Menses regular monthly last about 5 days, occasional clots nickel sized.  MGM: ovarian CA in her late 33's.  Maternal aunts, mother, & brother HTN.  Desires STD screening.    Personal health questionnaire:  Is patient Alexis Yang, have a family history of breast and/or ovarian cancer: yes Is there a family history of uterine cancer diagnosed at age < 38, gastrointestinal cancer, urinary tract cancer, family member who is a Personnel officer syndrome-associated carrier: no Is the patient overweight and hypertensive, family history of diabetes, personal history of gestational diabetes, preeclampsia or PCOS: yes, po meds for preeclampsia Is patient over 59, have PCOS,  family history of premature CHD under age 1, diabetes, smoke, have hypertension or peripheral artery disease:  yes At any time, has a partner hit, kicked or otherwise hurt or frightened you?: no Over the past 2 weeks, have you felt down, depressed or hopeless?: no Over the past 2 weeks, have you felt little interest or pleasure in doing things?:no   Gynecologic History Patient's last menstrual period was 03/24/2015. Contraception: abstinence Last Pap: 2014. Results were: normal Last mammogram: N/A.   Obstetric History OB History  Gravida Para Term Preterm AB SAB TAB Ectopic Multiple Living  1 1 1       1     # Outcome Date GA Lbr Len/2nd Weight Sex Delivery Anes PTL Lv  1 Term 08/18/13 [redacted]w[redacted]d 06:01 / 01:35 6 lb 10 oz (3.005 kg) M Vag-Spont EPI,Local  Y      Past Medical History  Diagnosis Date  . Sickle cell trait   . Infection     UTI  . Acne   . Abnormal Pap smear     colpo- ok since  . Condyloma acuminata of vulva in pregnancy   . GERD (gastroesophageal reflux disease)     Past Surgical History  Procedure  Laterality Date  . Colposcopy    . No past surgeries       Current outpatient prescriptions:  .  fluconazole (DIFLUCAN) 100 MG tablet, Take 1 tablet (100 mg total) by mouth once. Repeat dose in 48-72 hour., Disp: 3 tablet, Rfl: 0 .  hydrocortisone (ANUSOL-HC) 25 MG suppository, Place 1 suppository (25 mg total) rectally 2 (two) times daily., Disp: 12 suppository, Rfl: 0 .  Norethindrone-Ethinyl Estradiol-Fe Biphas (LO LOESTRIN FE) 1 MG-10 MCG / 10 MCG tablet, Take 1 tablet by mouth daily., Disp: 1 Package, Rfl: 11 No Known Allergies  History  Substance Use Topics  . Smoking status: Never Smoker   . Smokeless tobacco: Never Used  . Alcohol Use: No     Comment: occasional-    DRANK BEFORE  KNEW SHE WAS PREG.    Family History  Problem Relation Age of Onset  . Hypertension Mother   . Cancer Maternal Grandmother     stomach  . Diabetes Maternal Grandfather   . Hypertension Maternal Grandfather   . Diabetes Maternal Aunt       Review of Systems  Constitutional: negative for fatigue and weight loss Respiratory: negative for cough and wheezing Cardiovascular: negative for chest pain, fatigue and palpitations Gastrointestinal: negative for abdominal pain and change in bowel habits Musculoskeletal:negative for myalgias Neurological: negative for gait problems and tremors Behavioral/Psych: negative for  abusive relationship, depression Endocrine: negative for temperature intolerance   Genitourinary:negative for abnormal menstrual periods, genital lesions, hot flashes, sexual problems and vaginal discharge Integument/breast: negative for breast lump, breast tenderness, nipple discharge and skin lesion(s)    Objective:       BP 120/81 mmHg  Pulse 66  Temp(Src) 98.5 F (36.9 C)  Ht  (1.702 m)  Wt 159 lb (72.122 kg)  BMI 24.90 kg/m2  LMP 03/24/2015 General:   alert  Skin:   no rash or abnormalities  Lungs:   clear to auscultation bilaterally  Heart:   regular rate and  rhythm, S1, S2 normal, no murmur, click, rub or gallop  Breasts:   normal without suspicious masses, skin or nipple changes or axillary nodes  Abdomen:  normal findings: no organomegaly, soft, non-tender and no hernia  Pelvis:  External genitalia: normal general appearance Urinary system: urethral meatus normal and bladder without fullness, nontender Vaginal: normal without tenderness, induration or masses, + white discharge Cervix: normal appearance Adnexa: normal bimanual exam Uterus: anteverted and non-tender, normal size   Lab Review Urine pregnancy test Labs reviewed yes Radiologic studies reviewed no  50% of 30 min visit spent on counseling and coordination of care.   Assessment:    Healthy female exam.   Contraception counseling STD screening Vulvovaginal candidiasis hemorrhoids   Plan:    Education reviewed: depression evaluation, low fat, low cholesterol diet, safe sex/STD prevention, self breast exams, skin cancer screening and weight bearing exercise. Contraception: OCP (estrogen/progesterone). Follow up in: 1 year.   Meds ordered this encounter  Medications  . fluconazole (DIFLUCAN) 100 MG tablet    Sig: Take 1 tablet (100 mg total) by mouth once. Repeat dose in 48-72 hour.    Dispense:  3 tablet    Refill:  0  . hydrocortisone (ANUSOL-HC) 25 MG suppository    Sig: Place 1 suppository (25 mg total) rectally 2 (two) times daily.    Dispense:  12 suppository    Refill:  0  . Norethindrone-Ethinyl Estradiol-Fe Biphas (LO LOESTRIN FE) 1 MG-10 MCG / 10 MCG tablet    Sig: Take 1 tablet by mouth daily.    Dispense:  1 Package    Refill:  11   Orders Placed This Encounter  Procedures  . SureSwab, Vaginosis/Vaginitis Plus  . HIV antibody (with reflex)  . CBC with Differential/Platelet  . Comprehensive metabolic panel  . TSH  . Hepatitis B surface antigen  . RPR  . Hepatitis C antibody

## 2015-04-16 ENCOUNTER — Other Ambulatory Visit: Payer: Self-pay | Admitting: Certified Nurse Midwife

## 2015-04-16 DIAGNOSIS — N760 Acute vaginitis: Principal | ICD-10-CM

## 2015-04-16 DIAGNOSIS — B9689 Other specified bacterial agents as the cause of diseases classified elsewhere: Secondary | ICD-10-CM

## 2015-04-16 LAB — PAP IG AND HPV HIGH-RISK: HPV DNA High Risk: NOT DETECTED

## 2015-04-16 LAB — SURESWAB, VAGINOSIS/VAGINITIS PLUS
Atopobium vaginae: 6.3 Log (cells/mL)
C. GLABRATA, DNA: NOT DETECTED
C. TRACHOMATIS RNA, TMA: NOT DETECTED
C. albicans, DNA: NOT DETECTED
C. parapsilosis, DNA: NOT DETECTED
C. tropicalis, DNA: NOT DETECTED
Gardnerella vaginalis: 6.7 Log (cells/mL)
LACTOBACILLUS SPECIES: NOT DETECTED Log (cells/mL)
MEGASPHAERA SPECIES: NOT DETECTED Log (cells/mL)
N. GONORRHOEAE RNA, TMA: NOT DETECTED
T. vaginalis RNA, QL TMA: NOT DETECTED

## 2015-04-16 MED ORDER — METRONIDAZOLE 500 MG PO TABS: 500.0000 mg | ORAL_TABLET | Freq: Two times a day (BID) | ORAL | Status: AC

## 2015-05-15 ENCOUNTER — Encounter: Payer: Self-pay | Admitting: Certified Nurse Midwife

## 2015-05-15 ENCOUNTER — Other Ambulatory Visit: Payer: Self-pay | Admitting: Certified Nurse Midwife

## 2015-07-10 ENCOUNTER — Other Ambulatory Visit: Payer: Self-pay | Admitting: Certified Nurse Midwife

## 2015-07-10 IMAGING — US US OB COMP +14 WK
1 series · 12 of 28 positions shown · non-contrast
Comparison: none

[Series 1: us ob comp +14 wk · 12 of 58 slices shown]
[im 3/58]
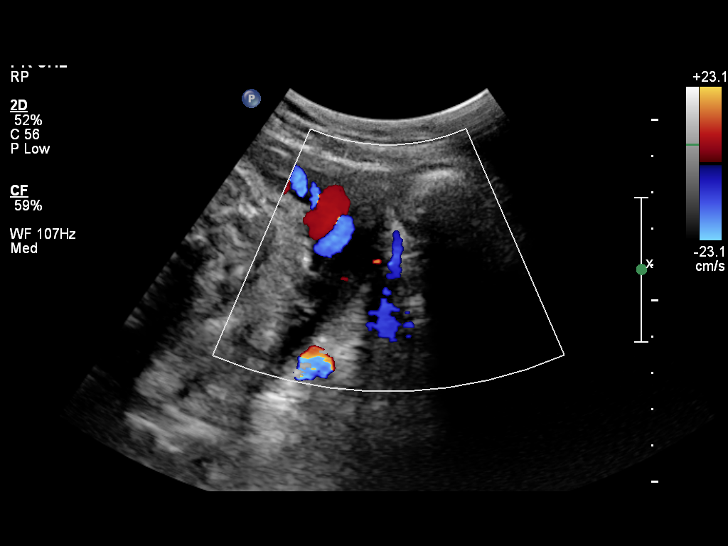
[im 7/58]
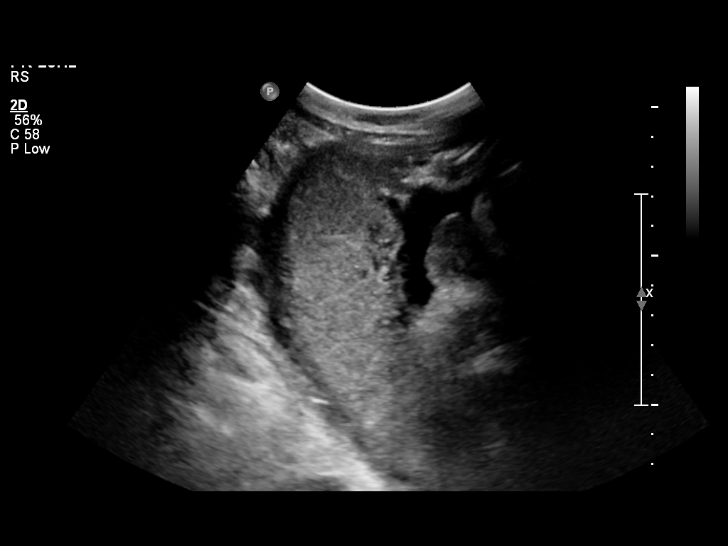
[im 11/58]
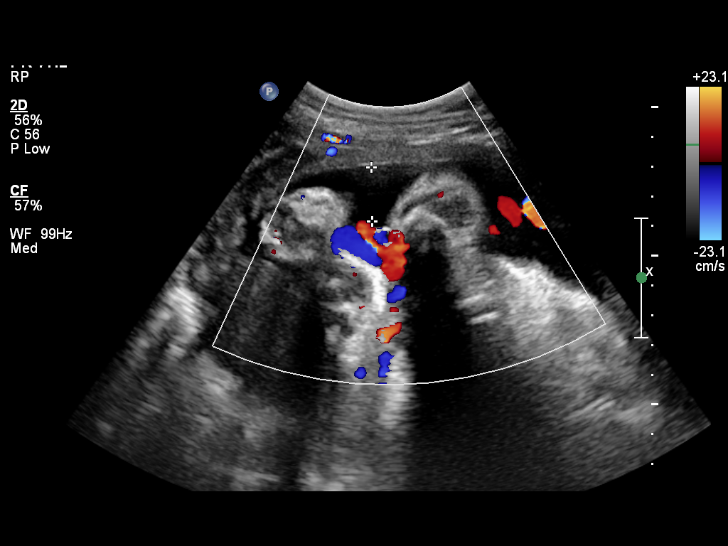
[im 17/58]
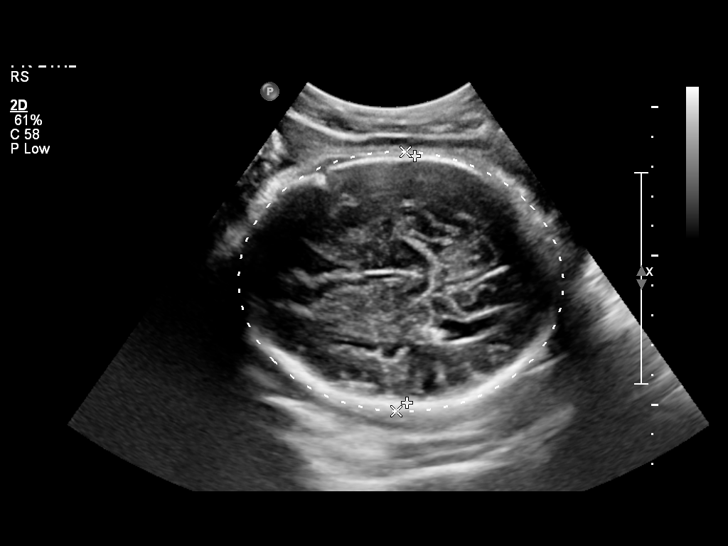
[im 22/58]
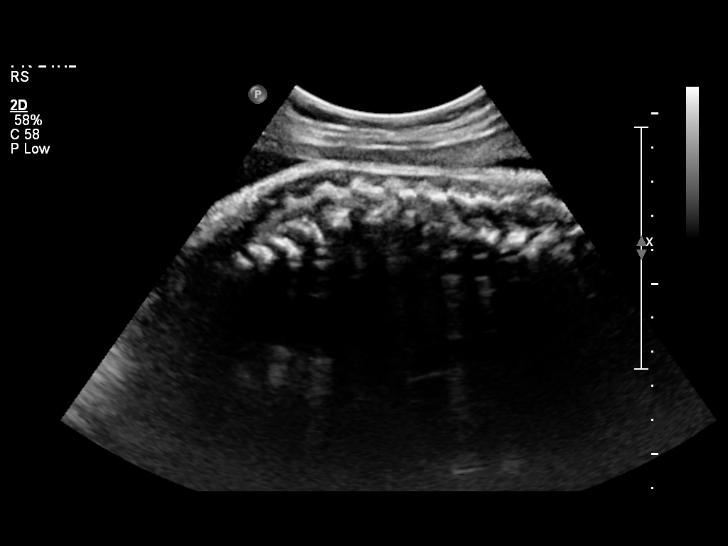
[im 26/58]
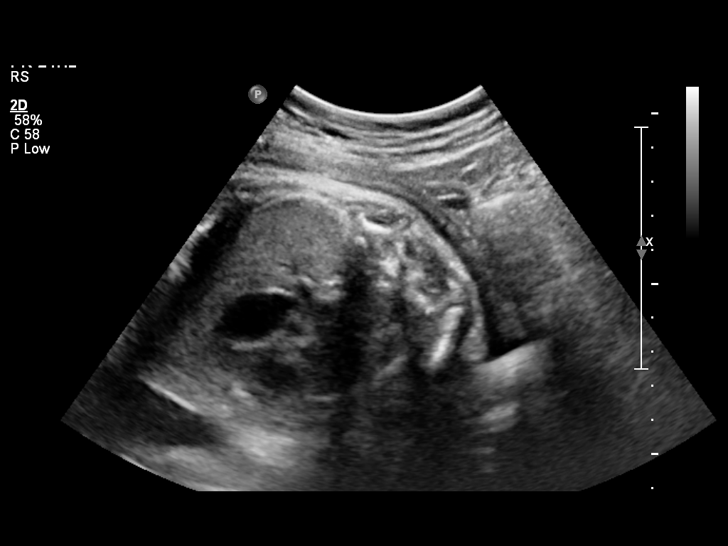
[im 32/58]
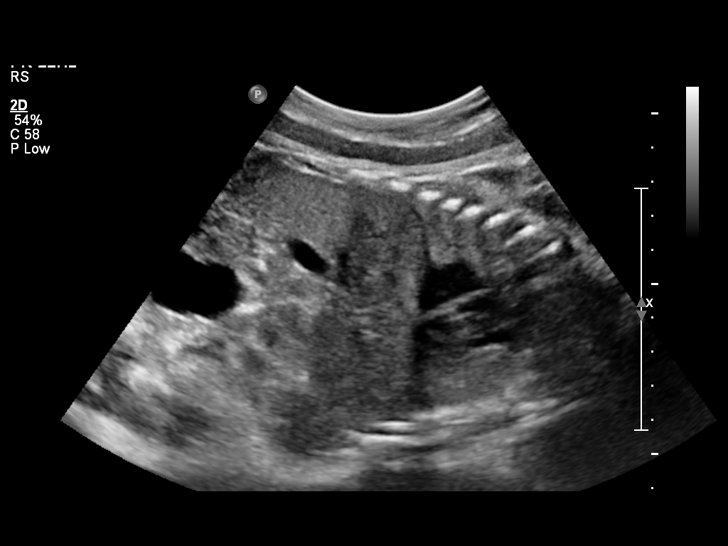
[im 36/58]
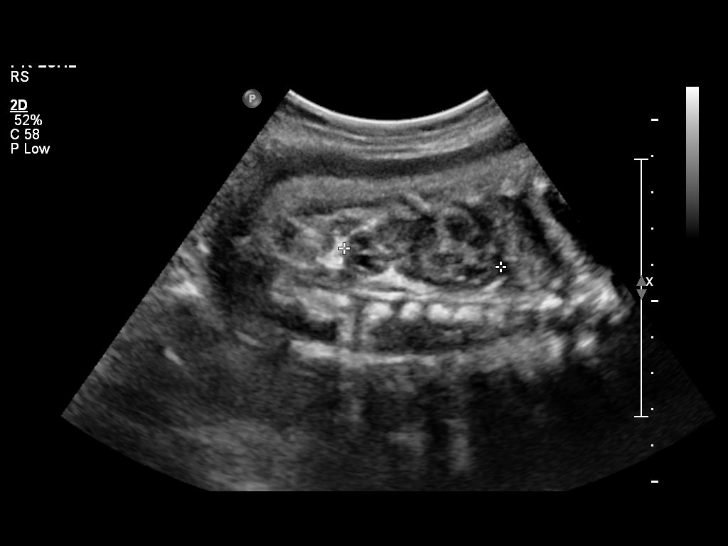
[im 41/58]
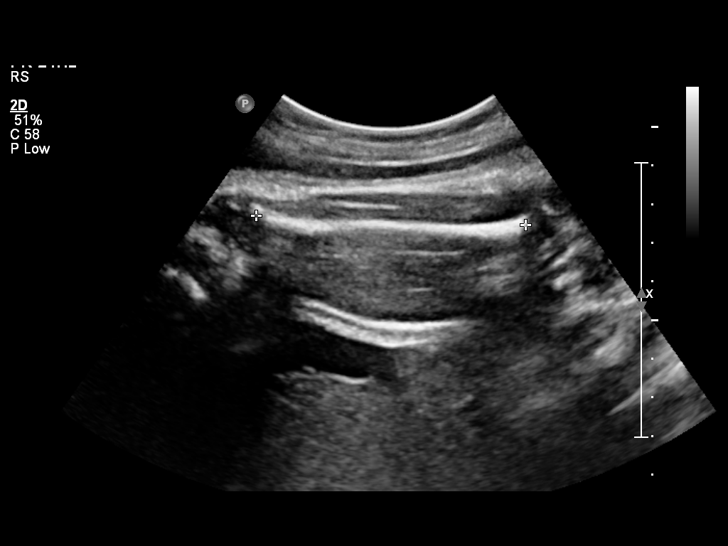
[im 47/58]
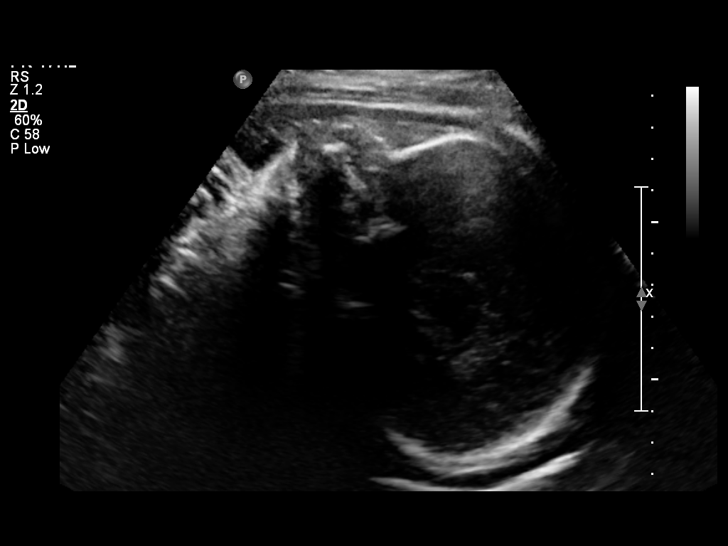
[im 51/58]
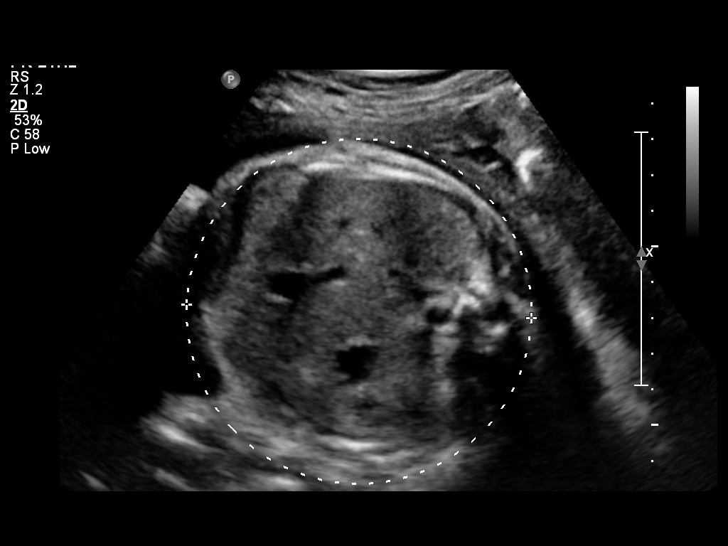
[im 55/58]
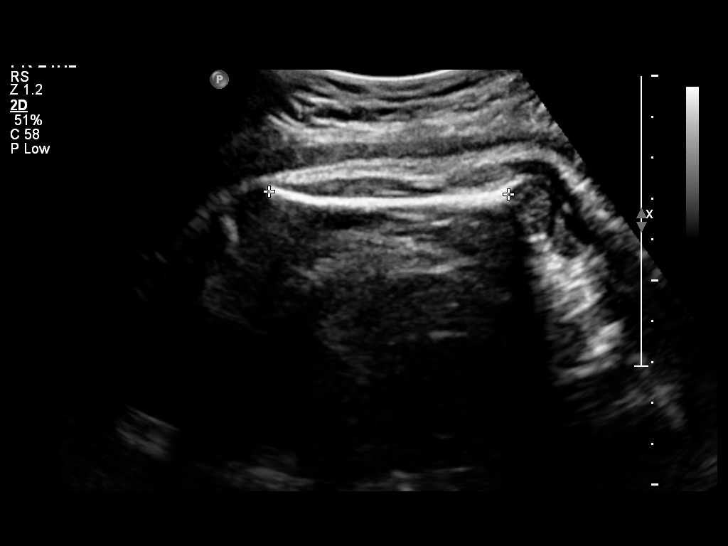

[12 of 28 positions shown; findings below may reference images not displayed]

OBSTETRICS REPORT
                      (Signed Final 07/27/2013 [DATE])

Service(s) Provided

 US OB COMP + 14 WK                                    76805.1
Indications

 Basic anatomic survey
Fetal Evaluation

 Num Of Fetuses:    1
 Fetal Heart Rate:  160                          bpm
 Cardiac Activity:  Observed
 Presentation:      Cephalic
 Placenta:          Posterior, above cervical
                    os
 P. Cord            Visualized, central
 Insertion:

 Amniotic Fluid
 AFI FV:      Subjectively within normal limits
 AFI Sum:     13.81   cm       47  %Tile     Larg Pckt:    5.36  cm
 RUQ:   3.76    cm   RLQ:    1.81   cm    LUQ:   5.36    cm   LLQ:    2.88   cm
Biometry

 BPD:     83.1  mm     G. Age:  33w 3d                CI:        72.39   70 - 86
                                                      FL/HC:      22.4   19.4 -

 HC:     310.7  mm     G. Age:  34w 5d       32  %    HC/AC:      1.04   0.96 -

 AC:       298  mm     G. Age:  33w 6d       47  %    FL/BPD:     83.9   71 - 87
 FL:      69.7  mm     G. Age:  35w 5d       83  %    FL/AC:      23.4   20 - 24
 HUM:     58.4  mm     G. Age:  33w 6d       55  %
 Est. FW:    4543  gm      5 lb 5 oz     67  %
Gestational Age

 LMP:           34w 0d        Date:  12/01/12                 EDD:   09/07/13
 U/S Today:     34w 3d                                        EDD:   09/04/13
 Best:          34w 0d     Det. By:  LMP  (12/01/12)          EDD:   09/07/13
Anatomy
 Cranium:          Appears normal         Aortic Arch:      Basic anatomy
                                                            exam per order
 Fetal Cavum:      Appears normal         Ductal Arch:      Basic anatomy
                                                            exam per order
 Ventricles:       Appears normal         Diaphragm:        Appears normal
 Choroid Plexus:   Appears normal         Stomach:          Appears normal, left
                                                            sided
 Cerebellum:       Appears normal         Abdomen:          Appears normal
 Posterior Fossa:  Appears normal         Abdominal Wall:   Not well visualized
 Nuchal Fold:      Not applicable (>20    Cord Vessels:     Appears normal (3
                   wks GA)                                  vessel cord)
 Face:             Not well visualized    Kidneys:          Appear normal
 Lips:             Appears normal         Bladder:          Appears normal
 Heart:            Appears normal         Spine:            Appears normal
                   (4CH, axis, and
                   situs)
 RVOT:             Appears normal         Lower             Visualized
                                          Extremities:
 LVOT:             Appears normal         Upper             Visualized
                                          Extremities:

 Other:  Complete fetal anatomic survey previously performed. Technically
         difficult due to advanced GA and fetal position.
Cervix Uterus Adnexa

 Cervix:       Not visualized (advanced GA >34 wks)
 Uterus:       No abnormality visualized.

 Left Ovary:    No adnexal mass visualized.
 Right Ovary:   No adnexal mass visualized.

 Adnexa:     No abnormality visualized.
Impression

 IUP at 34+0 weeks
 Normal detailed fetal anatomy; profile and CI were not
 optimally visualized
 Normal amniotic fluid volume
 Measurements consistent with LMP dating
Recommendations

 Follow-up as clinically indicated

 questions or concerns.

## 2015-07-16 ENCOUNTER — Telehealth: Payer: Self-pay | Admitting: *Deleted

## 2015-07-16 NOTE — Telephone Encounter (Signed)
Calling regarding an appointment for birth control.  Attempted to return patient's call and left message for patient to call the office.

## 2015-08-05 NOTE — Telephone Encounter (Signed)
Attempted to contact the patient and left message for patient to call the office.  

## 2016-01-17 NOTE — Telephone Encounter (Signed)
Patient has not returned call to office. Call to be re-filed.
